# Patient Record
Sex: Female | Born: 1975 | Hispanic: Yes | Marital: Married | Smoking: Never smoker
Health system: Southern US, Community
[De-identification: ages and names within clinical notes are randomized; demographics above are authoritative.]

## PROBLEM LIST (undated history)

## (undated) DIAGNOSIS — O24419 Gestational diabetes mellitus in pregnancy, unspecified control: Secondary | ICD-10-CM

## (undated) DIAGNOSIS — R87629 Unspecified abnormal cytological findings in specimens from vagina: Secondary | ICD-10-CM

## (undated) DIAGNOSIS — I639 Cerebral infarction, unspecified: Secondary | ICD-10-CM

## (undated) DIAGNOSIS — K219 Gastro-esophageal reflux disease without esophagitis: Secondary | ICD-10-CM

## (undated) HISTORY — DX: Unspecified abnormal cytological findings in specimens from vagina: R87.629

---

## 2013-10-21 ENCOUNTER — Ambulatory Visit: Payer: Self-pay | Admitting: Advanced Practice Midwife

## 2013-11-19 ENCOUNTER — Observation Stay: Payer: Self-pay

## 2013-11-19 LAB — URINALYSIS, COMPLETE
BILIRUBIN, UR: NEGATIVE
Bacteria: NONE SEEN
Blood: NEGATIVE
Glucose,UR: NEGATIVE mg/dL (ref 0–75)
Ketone: NEGATIVE
LEUKOCYTE ESTERASE: NEGATIVE
NITRITE: NEGATIVE
PROTEIN: NEGATIVE
Ph: 7 (ref 4.5–8.0)
RBC,UR: NONE SEEN /HPF (ref 0–5)
SPECIFIC GRAVITY: 1.004 (ref 1.003–1.030)
Squamous Epithelial: 1
WBC UR: <1 /HPF (ref 0–5)

## 2013-12-01 ENCOUNTER — Encounter: Payer: Self-pay | Admitting: Maternal & Fetal Medicine

## 2014-04-13 ENCOUNTER — Inpatient Hospital Stay: Payer: Self-pay | Admitting: Obstetrics and Gynecology

## 2014-04-13 LAB — CBC WITH DIFFERENTIAL/PLATELET
Basophil #: 0 10*3/uL (ref 0.0–0.1)
Basophil %: 0.5 %
EOS PCT: 1.3 %
Eosinophil #: 0.1 10*3/uL (ref 0.0–0.7)
HCT: 35.7 % (ref 35.0–47.0)
HGB: 12 g/dL (ref 12.0–16.0)
LYMPHS ABS: 2.9 10*3/uL (ref 1.0–3.6)
Lymphocyte %: 37.6 %
MCH: 28.5 pg (ref 26.0–34.0)
MCHC: 33.4 g/dL (ref 32.0–36.0)
MCV: 85 fL (ref 80–100)
MONOS PCT: 10 %
Monocyte #: 0.8 x10 3/mm (ref 0.2–0.9)
NEUTROS PCT: 50.6 %
Neutrophil #: 3.9 10*3/uL (ref 1.4–6.5)
PLATELETS: 275 10*3/uL (ref 150–440)
RBC: 4.19 10*6/uL (ref 3.80–5.20)
RDW: 16.9 % — ABNORMAL HIGH (ref 11.5–14.5)
WBC: 7.7 10*3/uL (ref 3.6–11.0)

## 2014-04-15 LAB — HEMATOCRIT: HCT: 35.1 % (ref 35.0–47.0)

## 2014-09-05 NOTE — H&P (Signed)
L&D Evaluation:  History:  HPI 39yo [id number] at 40+6 wks presenting for eval with decreased fetal movement x24hrs. On evaluation, reactive strip with accels and improved fetal movement. However, AFI low-normal at 6cm and fetal tracing revealed 1 late deceleration. The decision was made to proceed with induction for term pregnancy with low fluid in setting of decreased fetal movement.  Pregnancy complicated by: Prior 36wk delivery for 3#4oz baby- patient declined 17-P during this pregnancy. 3 prior NSVDs. Increased glucose screening test with normal 3hr Obesity- BMI 31 AMA Food insecurity Abnormal pap smear with plans for ppd colpo  Rh pos, Rubella immune, GBS neg   Presents with decreased fetal movement   Patient's Medical History No Chronic Illness   Patient's Surgical History none   Medications Pre Natal Vitamins   Allergies NKDA   Social History food insecure   Family History Non-Contributory   ROS:  ROS All systems were reviewed.  HEENT, CNS, GI, GU, Respiratory, CV, Renal and Musculoskeletal systems were found to be normal.   Exam:  Vital Signs stable   General no apparent distress   Mental Status clear   Chest clear   Heart normal sinus rhythm   Abdomen gravid, non-tender   Estimated Fetal Weight Average for gestational age   Fetal Position cephalic   Back no CVAT   Edema 2+  trace   Mebranes Intact   FHT single late decel   Ucx irregular   Impression:  Impression decreased fetal movement   Plan:  Plan IOL   Comments Bishop score indicates cervical ripening indicated. Will cervadil overnight and pitocin in am. Pt and family members present and aware.  Interview proceeded with hospital Spanish interpreter present.   Electronic Signatures: Cline CoolsBeasley, Reatha Sur E (MD)  (Signed 18-Dec-15 08:35)  Authored: L&D Evaluation   Last Updated: 18-Dec-15 08:35 by Cline CoolsBeasley, Analena Gama E (MD)

## 2016-02-27 DIAGNOSIS — N879 Dysplasia of cervix uteri, unspecified: Secondary | ICD-10-CM | POA: Insufficient documentation

## 2016-02-27 HISTORY — PX: COLPOSCOPY: SHX161

## 2018-05-19 LAB — HM PAP SMEAR: HM Pap smear: NEGATIVE

## 2018-05-19 LAB — HM HIV SCREENING LAB: HM HIV Screening: NEGATIVE

## 2018-08-10 DIAGNOSIS — Z8742 Personal history of other diseases of the female genital tract: Secondary | ICD-10-CM

## 2019-01-24 ENCOUNTER — Encounter: Payer: Self-pay | Admitting: *Deleted

## 2019-01-24 ENCOUNTER — Inpatient Hospital Stay
Admission: EM | Admit: 2019-01-24 | Discharge: 2019-01-29 | DRG: 123 | Disposition: A | Discharge status: Home / Self Care | Payer: Medicaid Other | Attending: Internal Medicine | Admitting: Internal Medicine

## 2019-01-24 ENCOUNTER — Emergency Department: Payer: Medicaid Other

## 2019-01-24 ENCOUNTER — Other Ambulatory Visit: Payer: Self-pay

## 2019-01-24 DIAGNOSIS — R739 Hyperglycemia, unspecified: Secondary | ICD-10-CM | POA: Diagnosis not present

## 2019-01-24 DIAGNOSIS — H5462 Unqualified visual loss, left eye, normal vision right eye: Secondary | ICD-10-CM | POA: Diagnosis present

## 2019-01-24 DIAGNOSIS — Z975 Presence of (intrauterine) contraceptive device: Secondary | ICD-10-CM

## 2019-01-24 DIAGNOSIS — Z8673 Personal history of transient ischemic attack (TIA), and cerebral infarction without residual deficits: Secondary | ICD-10-CM | POA: Diagnosis not present

## 2019-01-24 DIAGNOSIS — T380X5A Adverse effect of glucocorticoids and synthetic analogues, initial encounter: Secondary | ICD-10-CM | POA: Diagnosis not present

## 2019-01-24 DIAGNOSIS — Z20828 Contact with and (suspected) exposure to other viral communicable diseases: Secondary | ICD-10-CM | POA: Diagnosis present

## 2019-01-24 DIAGNOSIS — K219 Gastro-esophageal reflux disease without esophagitis: Secondary | ICD-10-CM | POA: Diagnosis present

## 2019-01-24 DIAGNOSIS — Z23 Encounter for immunization: Secondary | ICD-10-CM | POA: Diagnosis not present

## 2019-01-24 DIAGNOSIS — Z833 Family history of diabetes mellitus: Secondary | ICD-10-CM

## 2019-01-24 DIAGNOSIS — H469 Unspecified optic neuritis: Principal | ICD-10-CM | POA: Diagnosis present

## 2019-01-24 DIAGNOSIS — Z8632 Personal history of gestational diabetes: Secondary | ICD-10-CM | POA: Diagnosis not present

## 2019-01-24 DIAGNOSIS — K5900 Constipation, unspecified: Secondary | ICD-10-CM | POA: Diagnosis not present

## 2019-01-24 HISTORY — DX: Cerebral infarction, unspecified: I63.9

## 2019-01-24 HISTORY — DX: Gastro-esophageal reflux disease without esophagitis: K21.9

## 2019-01-24 HISTORY — DX: Gestational diabetes mellitus in pregnancy, unspecified control: O24.419

## 2019-01-24 LAB — CBC WITH DIFFERENTIAL/PLATELET
Abs Immature Granulocytes: 0.01 10*3/uL (ref 0.00–0.07)
Basophils Absolute: 0.1 10*3/uL (ref 0.0–0.1)
Basophils Relative: 1 %
Eosinophils Absolute: 0.1 10*3/uL (ref 0.0–0.5)
Eosinophils Relative: 2 %
HCT: 36.7 % (ref 36.0–46.0)
Hemoglobin: 11.6 g/dL — ABNORMAL LOW (ref 12.0–15.0)
Immature Granulocytes: 0 %
Lymphocytes Relative: 36 %
Lymphs Abs: 2.6 10*3/uL (ref 0.7–4.0)
MCH: 25.3 pg — ABNORMAL LOW (ref 26.0–34.0)
MCHC: 31.6 g/dL (ref 30.0–36.0)
MCV: 80.1 fL (ref 80.0–100.0)
Monocytes Absolute: 0.8 10*3/uL (ref 0.1–1.0)
Monocytes Relative: 12 %
Neutro Abs: 3.5 10*3/uL (ref 1.7–7.7)
Neutrophils Relative %: 49 %
Platelets: 277 10*3/uL (ref 150–400)
RBC: 4.58 MIL/uL (ref 3.87–5.11)
RDW: 16.1 % — ABNORMAL HIGH (ref 11.5–15.5)
WBC: 7.1 10*3/uL (ref 4.0–10.5)
nRBC: 0 % (ref 0.0–0.2)

## 2019-01-24 LAB — SEDIMENTATION RATE: Sed Rate: 36 mm/hr — ABNORMAL HIGH (ref 0–20)

## 2019-01-24 LAB — URINALYSIS, COMPLETE (UACMP) WITH MICROSCOPIC
Bacteria, UA: NONE SEEN
Bilirubin Urine: NEGATIVE
Glucose, UA: NEGATIVE mg/dL
Hgb urine dipstick: NEGATIVE
Ketones, ur: NEGATIVE mg/dL
Nitrite: NEGATIVE
Protein, ur: NEGATIVE mg/dL
Specific Gravity, Urine: 1.01 (ref 1.005–1.030)
pH: 7 (ref 5.0–8.0)

## 2019-01-24 LAB — BASIC METABOLIC PANEL
Anion gap: 12 (ref 5–15)
BUN: 12 mg/dL (ref 6–20)
CO2: 21 mmol/L — ABNORMAL LOW (ref 22–32)
Calcium: 8.9 mg/dL (ref 8.9–10.3)
Chloride: 104 mmol/L (ref 98–111)
Creatinine, Ser: 0.64 mg/dL (ref 0.44–1.00)
GFR calc Af Amer: >60 mL/min (ref >60)
GFR calc non Af Amer: >60 mL/min (ref >60)
Glucose, Bld: 101 mg/dL — ABNORMAL HIGH (ref 70–99)
Potassium: 3.9 mmol/L (ref 3.5–5.1)
Sodium: 137 mmol/L (ref 135–145)

## 2019-01-24 LAB — POCT PREGNANCY, URINE: Preg Test, Ur: NEGATIVE

## 2019-01-24 MED ORDER — HEPARIN SODIUM (PORCINE) 5000 UNIT/ML IJ SOLN
5000.0000 [IU] | Freq: Three times a day (TID) | INTRAMUSCULAR | Status: DC
  Administered 2019-01-24 – 2019-01-27 (×8): 5000 [IU] via SUBCUTANEOUS
  Filled 2019-01-24 (×6): qty 1

## 2019-01-24 MED ORDER — PANTOPRAZOLE SODIUM 40 MG IV SOLR
40.0000 mg | Freq: Once | INTRAVENOUS | Status: AC
  Administered 2019-01-24: 40 mg via INTRAVENOUS
  Filled 2019-01-24: qty 40

## 2019-01-24 MED ORDER — SODIUM CHLORIDE 0.9 % IV SOLN
1000.0000 mg | Freq: Every day | INTRAVENOUS | Status: DC
  Administered 2019-01-24: 1000 mg via INTRAVENOUS
  Filled 2019-01-24 (×3): qty 8

## 2019-01-24 MED ORDER — DIPHENHYDRAMINE HCL 50 MG/ML IJ SOLN
25.0000 mg | Freq: Once | INTRAMUSCULAR | Status: AC
  Administered 2019-01-24: 25 mg via INTRAVENOUS
  Filled 2019-01-24: qty 1

## 2019-01-24 MED ORDER — PROCHLORPERAZINE EDISYLATE 10 MG/2ML IJ SOLN
10.0000 mg | Freq: Once | INTRAMUSCULAR | Status: AC
  Administered 2019-01-24: 10 mg via INTRAVENOUS
  Filled 2019-01-24: qty 2

## 2019-01-24 MED ORDER — KETOROLAC TROMETHAMINE 30 MG/ML IJ SOLN
15.0000 mg | Freq: Once | INTRAMUSCULAR | Status: AC
  Administered 2019-01-24: 15 mg via INTRAVENOUS
  Filled 2019-01-24: qty 1

## 2019-01-24 MED ORDER — INFLUENZA VAC SPLIT QUAD 0.5 ML IM SUSY
0.5000 mL | PREFILLED_SYRINGE | INTRAMUSCULAR | Status: AC
  Administered 2019-01-25: 0.5 mL via INTRAMUSCULAR
  Filled 2019-01-24: qty 0.5

## 2019-01-24 MED ORDER — PANTOPRAZOLE SODIUM 40 MG PO TBEC
40.0000 mg | DELAYED_RELEASE_TABLET | Freq: Every day | ORAL | Status: DC
  Administered 2019-01-25 – 2019-01-29 (×5): 40 mg via ORAL
  Filled 2019-01-24 (×5): qty 1

## 2019-01-24 MED ORDER — GADOBUTROL 1 MMOL/ML IV SOLN
7.0000 mL | Freq: Once | INTRAVENOUS | Status: AC | PRN
  Administered 2019-01-24: 18:00:00 7 mL via INTRAVENOUS

## 2019-01-24 MED ORDER — TETRACAINE HCL 0.5 % OP SOLN
1.0000 [drp] | Freq: Once | OPHTHALMIC | Status: AC
  Administered 2019-01-24: 1 [drp] via OPHTHALMIC

## 2019-01-24 MED ORDER — DOCUSATE SODIUM 100 MG PO CAPS
100.0000 mg | ORAL_CAPSULE | Freq: Two times a day (BID) | ORAL | Status: DC | PRN
  Administered 2019-01-28: 100 mg via ORAL
  Filled 2019-01-24: qty 1

## 2019-01-24 MED ORDER — SODIUM CHLORIDE 0.9 % IV BOLUS
1000.0000 mL | Freq: Once | INTRAVENOUS | Status: AC
  Administered 2019-01-24: 1000 mL via INTRAVENOUS

## 2019-01-24 NOTE — ED Notes (Signed)
Charge RN Nira Conn called for a bed.

## 2019-01-24 NOTE — ED Notes (Addendum)
ED Provider at bedside with interpreter at bedside

## 2019-01-24 NOTE — ED Notes (Signed)
Report given to Silvia, RN

## 2019-01-24 NOTE — ED Notes (Signed)
Patient transported to MRI 

## 2019-01-24 NOTE — ED Triage Notes (Addendum)
Through [place] interpreter, patient states she has had a headache on left side of head 6 days and has gradually loss vision in left eye. Patient was sent here from [facility]. Patient has a history of CVA in 2001 and was positive for Covid 3 months ago. Patient also c/o sore throat and it hurts to swallow.

## 2019-01-24 NOTE — ED Provider Notes (Signed)
[facility] Emergency Department Provider Note  ____________________________________________   First MD Initiated Contact with Patient 01/24/19 1542     (approximate)  I have reviewed the triage vital signs and the nursing notes.   HISTORY  Chief Complaint Headache    HPI [facility] Higgins is a 43 y.o. female with reported history of stroke in the past here with eye pain.  History provided with Spanish interpreter.   Patient states her symptoms started approximately 6 to 7 days ago.  She initially noticed clouds in her left eye, with diminished vision.  She had a dull, aching, retrobulbar pain at that time.  Since then, she is developed progressively worsening and now essentially complete loss of vision of the left eye.  She says she can occasionally see shapes, but that is about it.  This is new.  She does report that she was told she had a stroke in [place] years ago, but says that it was stress related.  She denies any history of complex migraines.  Denies any facial numbness or weakness.  Denies any arm leg or numbness.  No other complaints.  No recent trauma.  No recent fevers or chills.  Of note, she did have COVID 3 months ago, but has gotten over this completely according to her report.       Past Medical History:  Diagnosis Date   GERD (gastroesophageal reflux disease)    Stroke [facility])     Patient Active Problem List   Diagnosis Date Noted   History of abnormal cervical Papanicolaou smear 08/10/2018    Past Surgical History:  Procedure Laterality Date   COLPOSCOPY  02/27/2016   Colpo bx results: Transformation zone tissue with no dysplasia or carcinoma identified    Prior to Admission medications   Medication Sig Start Date End Date Taking? Authorizing Provider  PARAGARD INTRAUTERINE COPPER IU by Intrauterine route. 05/19/18 05/19/28  Jerene Dilling, PA    Allergies Patient has no known allergies.  No family history on  file.  Social History Social History   Tobacco Use   Smoking status: Never Smoker   Smokeless tobacco: Never Used  Substance Use Topics   Alcohol use: Never    Frequency: Never   Drug use: Never    Review of Systems  Review of Systems  Constitutional: Positive for fatigue. Negative for fever.  HENT: Negative for congestion and sore throat.   Eyes: Positive for pain and visual disturbance.  Respiratory: Negative for cough and shortness of breath.   Cardiovascular: Negative for chest pain.  Gastrointestinal: Negative for abdominal pain, diarrhea, nausea and vomiting.  Genitourinary: Negative for flank pain.  Musculoskeletal: Negative for back pain and neck pain.  Skin: Negative for rash and wound.  Neurological: Negative for weakness.  All other systems reviewed and are negative.    ____________________________________________  PHYSICAL EXAM:      VITAL SIGNS: [facility] Triage Vitals  Enc Vitals Group     BP 01/24/19 1421 127/73     Pulse Rate 01/24/19 1421 73     Resp 01/24/19 1421 18     Temp 01/24/19 1421 98.5 F (36.9 C)     Temp Source 01/24/19 1421 Oral     SpO2 01/24/19 1421 99 %     Weight 01/24/19 1429 170 lb (77.1 kg)     Height 01/24/19 1429 5\' 4"  (1.626 m)     Head Circumference --      Peak Flow --  Pain Score 01/24/19 1427 10     Pain Loc --      Pain Edu? --      Excl. in GC? --      Physical Exam Vitals signs and nursing note reviewed.  Constitutional:      General: She is not in acute distress.    Appearance: She is well-developed.  HENT:     Head: Normocephalic and atraumatic.     Comments: Oropharynx clear.  No facial lesions.  TMs normal bilaterally. Eyes:     Conjunctiva/sclera: Conjunctivae normal.     Comments: Vision intact on right, essentially light/dark on left.  Relative afferent pupillary defect on the left.  IOP 12 bilaterally.  Neck:     Musculoskeletal: Neck supple.  Cardiovascular:     Rate and Rhythm: Normal rate and  regular rhythm.     Heart sounds: Normal heart sounds. No murmur. No friction rub.  Pulmonary:     Effort: Pulmonary effort is normal. No respiratory distress.     Breath sounds: Normal breath sounds. No wheezing or rales.  Abdominal:     General: There is no distension.     Palpations: Abdomen is soft.     Tenderness: There is no abdominal tenderness.  Skin:    General: Skin is warm.     Capillary Refill: Capillary refill takes less than 2 seconds.  Neurological:     Mental Status: She is alert and oriented to person, place, and time.     Motor: No abnormal muscle tone.       ____________________________________________   LABS (all labs ordered are listed, but only abnormal results are displayed)  Labs Reviewed  CBC WITH DIFFERENTIAL/PLATELET - Abnormal; Notable for the following components:      Result Value   Hemoglobin 11.6 (*)    MCH 25.3 (*)    RDW 16.1 (*)    All other components within normal limits  BASIC METABOLIC PANEL - Abnormal; Notable for the following components:   CO2 21 (*)    Glucose, Bld 101 (*)    All other components within normal limits  URINALYSIS, COMPLETE (UACMP) WITH MICROSCOPIC - Abnormal; Notable for the following components:   Color, Urine YELLOW (*)    APPearance HAZY (*)    Leukocytes,Ua LARGE (*)    All other components within normal limits  SARS CORONAVIRUS 2 (TAT 6-24 HRS)  RPR  HIV ANTIBODY (ROUTINE TESTING W REFLEX)  SEDIMENTATION RATE  C-REACTIVE PROTEIN  POC URINE PREG, [facility]  POCT PREGNANCY, URINE    ____________________________________________  EKG: As below. ________________________________________  RADIOLOGY All imaging, including plain films, CT scans, and ultrasounds, independently reviewed by me, and interpretations confirmed via formal radiology reads.  ED MD interpretation:   See [facility] course for personal interpretations.  Official radiology report(s): Ct Head Wo Contrast  Result Date: 01/24/2019 CLINICAL DATA:   Left side headache for 6 days with gradual onset of vision loss in the left eye. EXAM: CT HEAD WITHOUT CONTRAST TECHNIQUE: Contiguous axial images were obtained from the base of the skull through the vertex without intravenous contrast. COMPARISON:  None. FINDINGS: Brain: No evidence of acute infarction, hemorrhage, hydrocephalus, extra-axial collection or mass lesion/mass effect. Vascular: No hyperdense vessel or unexpected calcification. Skull: Normal. Negative for fracture or focal lesion. Sinuses/Orbits: Negative. Other: None. IMPRESSION: Normal head CT. Electronically Signed   By: Drusilla Kanner M.D.   On: 01/24/2019 15:48   Mr Laqueta Jean And Wo Contrast  Result Date: 01/24/2019  CLINICAL DATA:  Neuro deficit(s), headache, basilar or orbital headache, visual loss, left afferent pupillary defect. Additional history provided: Patient reports headache on left side of head for 6 days and gradual loss of vision in left eye. History of CVA in 2001. EXAM: MRI HEAD AND ORBITS WITHOUT AND WITH CONTRAST TECHNIQUE: Multiplanar, multiecho pulse sequences of the brain and surrounding structures were obtained without and with intravenous contrast. Multiplanar, multiecho pulse sequences of the orbits and surrounding structures were obtained including fat saturation techniques, before and after intravenous contrast administration. CONTRAST:  7mL GADAVIST GADOBUTROL 1 MMOL/ML IV SOLN COMPARISON:  Head CT 01/24/2019 FINDINGS: MRI HEAD FINDINGS Brain: There is no convincing evidence of acute infarct. No evidence of intracranial mass. No midline shift or extra-axial fluid collection. No chronic intracranial blood products. No focal parenchymal signal abnormality. Cerebral volume is normal for age. No abnormal intracranial enhancement is identified. Vascular: Flow voids maintained within the proximal large arterial vessels. Skull and upper cervical spine: No focal marrow lesion MRI ORBITS FINDINGS Orbits: There is abnormal T2  hyperintensity and swelling of the intracanalicular and intraorbital left optic nerve. Associated abnormal enhancement of the intracanalicular and intraorbital left optic nerve sheath. Additionally, there is abnormal enhancement of the intraorbital left optic nerve distally. There is apparent increased DWI signal within the intraorbital left optic nerve on concurrent brain MRI, although no definite corresponding ADC hypointensity is identified. The globes are normal in size and contour. The extraocular muscles are grossly symmetric. The suprasellar cistern is patent. No mass effect upon the optic chiasm. Visualized sinuses: Mild scattered paranasal sinus mucosal thickening. Frothy secretions within right-sided ethmoid air cells. Small right sphenoid sinus air-fluid level. Soft tissues: Negative Limited intracranial: Reported above under the MRI brain findings section. These results were called by telephone at the time of interpretation on 01/24/2019 at 6:55 pm to provider Shaune Pollack , who verbally acknowledged these results. IMPRESSION: MRI orbits: 1. T2 hyperintense signal abnormality and swelling of the intracanalicular and intraorbital left optic nerve. Associated abnormal enhancement of the intracanalicular and intraorbital left optic nerve sheath, as well as abnormal enhancement of the intraorbital left optic nerve distally. Findings are consistent with optic neuritis. 2. Paranasal sinus disease as described. This includes frothy secretions within right ethmoid air cells and a small right sphenoid sinus air-fluid level. Correlate for acute sinusitis. MRI head: Unremarkable MRI appearance of the brain for age. Electronically Signed   By: Jackey Loge   On: 01/24/2019 18:56   Mr Rockwell [place] Wo Contrast  Result Date: 01/24/2019 CLINICAL DATA:  Neuro deficit(s), headache, basilar or orbital headache, visual loss, left afferent pupillary defect. Additional history provided: Patient reports headache on left side  of head for 6 days and gradual loss of vision in left eye. History of CVA in 2001. EXAM: MRI HEAD AND ORBITS WITHOUT AND WITH CONTRAST TECHNIQUE: Multiplanar, multiecho pulse sequences of the brain and surrounding structures were obtained without and with intravenous contrast. Multiplanar, multiecho pulse sequences of the orbits and surrounding structures were obtained including fat saturation techniques, before and after intravenous contrast administration. CONTRAST:  74mL GADAVIST GADOBUTROL 1 MMOL/ML IV SOLN COMPARISON:  Head CT 01/24/2019 FINDINGS: MRI HEAD FINDINGS Brain: There is no convincing evidence of acute infarct. No evidence of intracranial mass. No midline shift or extra-axial fluid collection. No chronic intracranial blood products. No focal parenchymal signal abnormality. Cerebral volume is normal for age. No abnormal intracranial enhancement is identified. Vascular: Flow voids maintained within the proximal large arterial vessels. Skull  and upper cervical spine: No focal marrow lesion MRI ORBITS FINDINGS Orbits: There is abnormal T2 hyperintensity and swelling of the intracanalicular and intraorbital left optic nerve. Associated abnormal enhancement of the intracanalicular and intraorbital left optic nerve sheath. Additionally, there is abnormal enhancement of the intraorbital left optic nerve distally. There is apparent increased DWI signal within the intraorbital left optic nerve on concurrent brain MRI, although no definite corresponding ADC hypointensity is identified. The globes are normal in size and contour. The extraocular muscles are grossly symmetric. The suprasellar cistern is patent. No mass effect upon the optic chiasm. Visualized sinuses: Mild scattered paranasal sinus mucosal thickening. Frothy secretions within right-sided ethmoid air cells. Small right sphenoid sinus air-fluid level. Soft tissues: Negative Limited intracranial: Reported above under the MRI brain findings section.  These results were called by telephone at the time of interpretation on 01/24/2019 at 6:55 pm to provider Shaune PollackAMERON Eleftherios Dudenhoeffer , who verbally acknowledged these results. IMPRESSION: MRI orbits: 1. T2 hyperintense signal abnormality and swelling of the intracanalicular and intraorbital left optic nerve. Associated abnormal enhancement of the intracanalicular and intraorbital left optic nerve sheath, as well as abnormal enhancement of the intraorbital left optic nerve distally. Findings are consistent with optic neuritis. 2. Paranasal sinus disease as described. This includes frothy secretions within right ethmoid air cells and a small right sphenoid sinus air-fluid level. Correlate for acute sinusitis. MRI head: Unremarkable MRI appearance of the brain for age. Electronically Signed   By: Jackey LogeKyle  Golden   On: 01/24/2019 18:56    ____________________________________________  PROCEDURES   Procedure(s) performed (including Critical Care):  Procedures  ____________________________________________  INITIAL IMPRESSION / MDM / ASSESSMENT AND PLAN / [facility] COURSE  As part of my medical decision making, I reviewed the following data within the electronic MEDICAL RECORD NUMBER Notes from prior [facility] visits and [place] Controlled Substance Database      *Bonnita HollowGeraldina Villalobos Flores was evaluated in Emergency Department on 01/24/2019 for the symptoms described in the history of present illness. She was evaluated in the context of the global COVID-19 pandemic, which necessitated consideration that the patient might be at risk for infection with the SARS-CoV-2 virus that causes COVID-19. Institutional protocols and algorithms that pertain to the evaluation of patients at risk for COVID-19 are in a state of rapid change based on information released by regulatory bodies including the CDC and federal and state organizations. These policies and algorithms were followed during the patient's care in the [facility].  Some [facility] evaluations and  interventions may be delayed as a result of limited staffing during the pandemic.*   Clinical Course as of Jan 23 1934  Mon Jan 24, 2019  1550 Normal sinus rhythm, ventricular rate 68.  PR 172, QRS 85, QTc 400.  No acute ST or T-segment elevation or depression.  [facility] EKG [CI]  1629 No acute abnormality.  CT Head Wo Contrast [CI]  [id number] year old female here with left eye pain and loss of vision.  Primary concern is optic neuritis, optic ischemia, or other post chiasmal abnormality such as mass or lesion.  Intraocular pressures been without signs of acute glaucoma.  There is no eye redness or symptoms to suggest iritis or uveitis.  Vision loss is total, without apparent retinal detachment on funduscopic exam or clinically.  Will follow-up MRIs.  CT scan negative.  Labs reassuring.   [CI]  1934 MRI shows optic neuritis.  [facility] LIkely needs 5d of IV Steroids. Will admit here for  neuro c/s in AM. Solumedrol 1 g IV ordered.   [CI]    Clinical Course User Index [CI] Shaune Pollack, MD    Medical Decision Making:  As above.   ____________________________________________  FINAL CLINICAL IMPRESSION(S) / [facility] DIAGNOSES  Final diagnoses:  Optic neuritis     MEDICATIONS GIVEN DURING THIS VISIT:  Medications  methylPREDNISolone sodium succinate (SOLU-MEDROL) 1,000 mg in sodium chloride 0.9 % 50 mL IVPB (has no administration in time range)  pantoprazole (PROTONIX) injection 40 mg (has no administration in time range)  prochlorperazine (COMPAZINE) injection 10 mg (10 mg Intravenous Given 01/24/19 1637)  diphenhydrAMINE (BENADRYL) injection 25 mg (25 mg Intravenous Given 01/24/19 1642)  ketorolac (TORADOL) 30 MG/ML injection 15 mg (15 mg Intravenous Given 01/24/19 1636)  sodium chloride 0.9 % bolus 1,000 mL (1,000 mLs Intravenous New Bag/Given 01/24/19 1634)  tetracaine (PONTOCAINE) 0.5 % ophthalmic solution 1 drop (1 drop Left Eye Given 01/24/19 1634)  gadobutrol  (GADAVIST) 1 MMOL/ML injection 7 mL (7 mLs Intravenous Contrast Given 01/24/19 1753)     [facility] Discharge Orders    None       Note:  This document was prepared using Dragon voice recognition software and may include unintentional dictation errors.   Shaune Pollack, MD 01/24/19 Barry Brunner

## 2019-01-24 NOTE — ED Notes (Signed)
Report given to Stephen, RN 

## 2019-01-24 NOTE — H&P (Signed)
[employer] - [place] [facility]   PATIENT NAME: Chelsea Higgins    MR#:  [id number]  DATE OF BIRTH:  [birth year]  DATE OF ADMISSION:  01/24/2019  PRIMARY CARE PHYSICIAN: Patient, No Pcp Per   REQUESTING/REFERRING PHYSICIAN: isaacs  CHIEF COMPLAINT:   Chief Complaint  Patient presents with  . Headache    HISTORY OF PRESENT ILLNESS: Chelsea Higgins  is a 43 y.o. female with a known history of gastroesophageal reflux disease, gestational diabetes, stroke-started having headache and loss of vision from left eye for last 6 days.  This is persistence and not going away so decided to come to emergency room.  On further questioning she says that she sees some bright lights and some grayish color to her left eye but cannot see clear. She denies any associated focal neurological symptoms like weakness, tingling, numbness.  She denies any trauma.  She denies any similar episodes in the past. In ER MRI of the brain reported optic neuritis.  ER physician spoke to tele-neurologist who suggested to give 1 g of Solu-Medrol IV for 5 days and admit for further management.  PAST MEDICAL HISTORY:   Past Medical History:  Diagnosis Date  . GERD (gastroesophageal reflux disease)   . Gestational diabetes   . Stroke [facility])     PAST SURGICAL HISTORY:  Past Surgical History:  Procedure Laterality Date  . COLPOSCOPY  02/27/2016   Colpo bx results: Transformation zone tissue with no dysplasia or carcinoma identified    SOCIAL HISTORY:  Social History   Tobacco Use  . Smoking status: Never Smoker  . Smokeless tobacco: Never Used  Substance Use Topics  . Alcohol use: Never    Frequency: Never    FAMILY HISTORY:  Family History  Problem Relation Age of Onset  . Diabetes Mother     DRUG ALLERGIES: No Known Allergies  REVIEW OF SYSTEMS:   CONSTITUTIONAL: No fever, fatigue or weakness.  EYES: She have blurred vision from left eye.  EARS, NOSE, AND  THROAT: No tinnitus or ear pain.  RESPIRATORY: No cough, shortness of breath, wheezing or hemoptysis.  CARDIOVASCULAR: No chest pain, orthopnea, edema.  GASTROINTESTINAL: No nausea, vomiting, diarrhea or abdominal pain.  GENITOURINARY: No dysuria, hematuria.  ENDOCRINE: No polyuria, nocturia,  HEMATOLOGY: No anemia, easy bruising or bleeding SKIN: No rash or lesion. MUSCULOSKELETAL: No joint pain or arthritis.   NEUROLOGIC: No tingling, numbness, weakness.  PSYCHIATRY: No anxiety or depression.   MEDICATIONS AT HOME:  Prior to Admission medications   Medication Sig Start Date End Date Taking? Authorizing Provider  PARAGARD INTRAUTERINE COPPER IU by Intrauterine route. 05/19/18 05/19/28  Matt Holmes, PA      PHYSICAL EXAMINATION:   VITAL SIGNS: Blood pressure 137/81, pulse 72, temperature 98.5 F (36.9 C), temperature source Oral, resp. rate 14, height  (1.626 m), weight 77.1 kg, last menstrual period 01/04/2019, SpO2 100 %.  GENERAL:  43 y.o.-year-old patient lying in the bed with no acute distress.  EYES: Pupils equal, round, reactive to light and accommodation. No scleral icterus. Extraocular muscles intact.  HEENT: Head atraumatic, normocephalic. Oropharynx and nasopharynx clear.  NECK:  Supple, no jugular venous distention. No thyroid enlargement, no tenderness.  LUNGS: Normal breath sounds bilaterally, no wheezing, rales,rhonchi or crepitation. No use of accessory muscles of respiration.  CARDIOVASCULAR: S1, S2 normal. No murmurs, rubs, or gallops.  ABDOMEN: Soft, nontender, nondistended. Bowel sounds present. No organomegaly or mass.  EXTREMITIES: No pedal edema, cyanosis, or  clubbing.  NEUROLOGIC: Cranial nerves II through XII are intact. Muscle strength 5/5 in all extremities. Sensation intact. Gait not checked.  PSYCHIATRIC: The patient is alert and oriented x 3.  SKIN: No obvious rash, lesion, or ulcer.   LABORATORY PANEL:   CBC Recent Labs  Lab  01/24/19 1513  WBC 7.1  HGB 11.6*  HCT 36.7  PLT 277  MCV 80.1  MCH 25.3*  MCHC 31.6  RDW 16.1*  LYMPHSABS 2.6  MONOABS 0.8  EOSABS 0.1  BASOSABS 0.1   ------------------------------------------------------------------------------------------------------------------  Chemistries  Recent Labs  Lab 01/24/19 1513  NA 137  K 3.9  CL 104  CO2 21*  GLUCOSE 101*  BUN 12  CREATININE 0.64  CALCIUM 8.9   ------------------------------------------------------------------------------------------------------------------ estimated creatinine clearance is 93.1 mL/min (by C-G formula based on SCr of 0.64 mg/dL). ------------------------------------------------------------------------------------------------------------------ No results for input(s): TSH, T4TOTAL, T3FREE, THYROIDAB in the last 72 hours.  Invalid input(s): FREET3   Coagulation profile No results for input(s): INR, PROTIME in the last 168 hours. ------------------------------------------------------------------------------------------------------------------- No results for input(s): DDIMER in the last 72 hours. -------------------------------------------------------------------------------------------------------------------  Cardiac Enzymes No results for input(s): CKMB, TROPONINI, MYOGLOBIN in the last 168 hours.  Invalid input(s): CK ------------------------------------------------------------------------------------------------------------------ Invalid input(s): POCBNP  ---------------------------------------------------------------------------------------------------------------  Urinalysis    Component Value Date/Time   COLORURINE YELLOW (A) 01/24/2019 1513   APPEARANCEUR HAZY (A) 01/24/2019 1513   APPEARANCEUR Clear 11/19/2013 1932   LABSPEC 1.010 01/24/2019 1513   LABSPEC 1.004 11/19/2013 1932   PHURINE 7.0 01/24/2019 1513   GLUCOSEU NEGATIVE 01/24/2019 1513   GLUCOSEU Negative 11/19/2013 1932    HGBUR NEGATIVE 01/24/2019 1513   BILIRUBINUR NEGATIVE 01/24/2019 1513   BILIRUBINUR Negative 11/19/2013 1932   KETONESUR NEGATIVE 01/24/2019 1513   PROTEINUR NEGATIVE 01/24/2019 1513   NITRITE NEGATIVE 01/24/2019 1513   LEUKOCYTESUR LARGE (A) 01/24/2019 1513   LEUKOCYTESUR Negative 11/19/2013 1932     RADIOLOGY: Ct Head Wo Contrast  Result Date: 01/24/2019 CLINICAL DATA:  Left side headache for 6 days with gradual onset of vision loss in the left eye. EXAM: CT HEAD WITHOUT CONTRAST TECHNIQUE: Contiguous axial images were obtained from the base of the skull through the vertex without intravenous contrast. COMPARISON:  None. FINDINGS: Brain: No evidence of acute infarction, hemorrhage, hydrocephalus, extra-axial collection or mass lesion/mass effect. Vascular: No hyperdense vessel or unexpected calcification. Skull: Normal. Negative for fracture or focal lesion. Sinuses/Orbits: Negative. Other: None. IMPRESSION: Normal head CT. Electronically Signed   By: Inge Rise M.D.   On: 01/24/2019 15:48   Mr Brain W And Wo Contrast  Result Date: 01/24/2019 CLINICAL DATA:  Neuro deficit(s), headache, basilar or orbital headache, visual loss, left afferent pupillary defect. Additional history provided: Patient reports headache on left side of head for 6 days and gradual loss of vision in left eye. History of CVA in 2001. EXAM: MRI HEAD AND ORBITS WITHOUT AND WITH CONTRAST TECHNIQUE: Multiplanar, multiecho pulse sequences of the brain and surrounding structures were obtained without and with intravenous contrast. Multiplanar, multiecho pulse sequences of the orbits and surrounding structures were obtained including fat saturation techniques, before and after intravenous contrast administration. CONTRAST:  59mL GADAVIST GADOBUTROL 1 MMOL/ML IV SOLN COMPARISON:  Head CT 01/24/2019 FINDINGS: MRI HEAD FINDINGS Brain: There is no convincing evidence of acute infarct. No evidence of intracranial mass. No midline  shift or extra-axial fluid collection. No chronic intracranial blood products. No focal parenchymal signal abnormality. Cerebral volume is normal for age. No abnormal intracranial enhancement is identified. Vascular: Flow voids  maintained within the proximal large arterial vessels. Skull and upper cervical spine: No focal marrow lesion MRI ORBITS FINDINGS Orbits: There is abnormal T2 hyperintensity and swelling of the intracanalicular and intraorbital left optic nerve. Associated abnormal enhancement of the intracanalicular and intraorbital left optic nerve sheath. Additionally, there is abnormal enhancement of the intraorbital left optic nerve distally. There is apparent increased DWI signal within the intraorbital left optic nerve on concurrent brain MRI, although no definite corresponding ADC hypointensity is identified. The globes are normal in size and contour. The extraocular muscles are grossly symmetric. The suprasellar cistern is patent. No mass effect upon the optic chiasm. Visualized sinuses: Mild scattered paranasal sinus mucosal thickening. Frothy secretions within right-sided ethmoid air cells. Small right sphenoid sinus air-fluid level. Soft tissues: Negative Limited intracranial: Reported above under the MRI brain findings section. These results were called by telephone at the time of interpretation on 01/24/2019 at 6:55 pm to provider Shaune Pollack , who verbally acknowledged these results. IMPRESSION: MRI orbits: 1. T2 hyperintense signal abnormality and swelling of the intracanalicular and intraorbital left optic nerve. Associated abnormal enhancement of the intracanalicular and intraorbital left optic nerve sheath, as well as abnormal enhancement of the intraorbital left optic nerve distally. Findings are consistent with optic neuritis. 2. Paranasal sinus disease as described. This includes frothy secretions within right ethmoid air cells and a small right sphenoid sinus air-fluid level.  Correlate for acute sinusitis. MRI head: Unremarkable MRI appearance of the brain for age. Electronically Signed   By: Jackey Loge   On: 01/24/2019 18:56   Mr [place] Wo Contrast  Result Date: 01/24/2019 CLINICAL DATA:  Neuro deficit(s), headache, basilar or orbital headache, visual loss, left afferent pupillary defect. Additional history provided: Patient reports headache on left side of head for 6 days and gradual loss of vision in left eye. History of CVA in 2001. EXAM: MRI HEAD AND ORBITS WITHOUT AND WITH CONTRAST TECHNIQUE: Multiplanar, multiecho pulse sequences of the brain and surrounding structures were obtained without and with intravenous contrast. Multiplanar, multiecho pulse sequences of the orbits and surrounding structures were obtained including fat saturation techniques, before and after intravenous contrast administration. CONTRAST:  52mL GADAVIST GADOBUTROL 1 MMOL/ML IV SOLN COMPARISON:  Head CT 01/24/2019 FINDINGS: MRI HEAD FINDINGS Brain: There is no convincing evidence of acute infarct. No evidence of intracranial mass. No midline shift or extra-axial fluid collection. No chronic intracranial blood products. No focal parenchymal signal abnormality. Cerebral volume is normal for age. No abnormal intracranial enhancement is identified. Vascular: Flow voids maintained within the proximal large arterial vessels. Skull and upper cervical spine: No focal marrow lesion MRI ORBITS FINDINGS Orbits: There is abnormal T2 hyperintensity and swelling of the intracanalicular and intraorbital left optic nerve. Associated abnormal enhancement of the intracanalicular and intraorbital left optic nerve sheath. Additionally, there is abnormal enhancement of the intraorbital left optic nerve distally. There is apparent increased DWI signal within the intraorbital left optic nerve on concurrent brain MRI, although no definite corresponding ADC hypointensity is identified. The globes are normal in size and  contour. The extraocular muscles are grossly symmetric. The suprasellar cistern is patent. No mass effect upon the optic chiasm. Visualized sinuses: Mild scattered paranasal sinus mucosal thickening. Frothy secretions within right-sided ethmoid air cells. Small right sphenoid sinus air-fluid level. Soft tissues: Negative Limited intracranial: Reported above under the MRI brain findings section. These results were called by telephone at the time of interpretation on 01/24/2019 at 6:55 pm to provider [facility] ,  who verbally acknowledged these results. IMPRESSION: MRI orbits: 1. T2 hyperintense signal abnormality and swelling of the intracanalicular and intraorbital left optic nerve. Associated abnormal enhancement of the intracanalicular and intraorbital left optic nerve sheath, as well as abnormal enhancement of the intraorbital left optic nerve distally. Findings are consistent with optic neuritis. 2. Paranasal sinus disease as described. This includes frothy secretions within right ethmoid air cells and a small right sphenoid sinus air-fluid level. Correlate for acute sinusitis. MRI head: Unremarkable MRI appearance of the brain for age. Electronically Signed   By: Kyle  Golden   On: 01/24/2019 18:56    EJackey LogeKG: Orders placed or performed during the hospital encounter of 01/24/19  . ED EKG  . ED EKG  . EKG 12-Lead  . EKG 12-Lead    IMPRESSION AND PLAN:  *Optic neuritis Confirmed by MRI. ER physician spoke to tele-neurologist who suggested to give 1 g IV Solu-Medrol daily for 5 days. Started by ER.  I will call neurology consult for in-house help.  *Gastroesophageal reflux disease I will give oral pantoprazole while in hospital.  All the records are reviewed and case discussed with ED provider. Management plans discussed with the patient, family and they are in agreement.  CODE STATUS: Full code    TOTAL TIME TAKING CARE OF THIS PATIENT: 45 minutes.    Altamese DillingVaibhavkumar Ceili Boshers M.D on  01/24/2019   Between 7am to 6pm - Pager - [phone]  After 6pm go to [email or web address] - [email or web address]  [facility]  Office  [phone]  CC: Primary care physician; Patient, No Pcp Per   Note: This dictation was prepared with Dragon dictation along with smaller phrase technology. Any transcriptional errors that result from this process are unintentional.

## 2019-01-24 NOTE — ED Notes (Signed)
Patient transported to CT 

## 2019-01-24 NOTE — ED Notes (Signed)
Worsening loss of L eye vision over 6days ago with L sided headache as well. Hx of CVA

## 2019-01-25 ENCOUNTER — Inpatient Hospital Stay: Payer: Medicaid Other

## 2019-01-25 DIAGNOSIS — H469 Unspecified optic neuritis: Principal | ICD-10-CM

## 2019-01-25 LAB — CBC
HCT: 37.4 % (ref 36.0–46.0)
Hemoglobin: 12 g/dL (ref 12.0–15.0)
MCH: 25.5 pg — ABNORMAL LOW (ref 26.0–34.0)
MCHC: 32.1 g/dL (ref 30.0–36.0)
MCV: 79.4 fL — ABNORMAL LOW (ref 80.0–100.0)
Platelets: 306 10*3/uL (ref 150–400)
RBC: 4.71 MIL/uL (ref 3.87–5.11)
RDW: 16.1 % — ABNORMAL HIGH (ref 11.5–15.5)
WBC: 5.9 10*3/uL (ref 4.0–10.5)
nRBC: 0 % (ref 0.0–0.2)

## 2019-01-25 LAB — BASIC METABOLIC PANEL
Anion gap: 12 (ref 5–15)
BUN: 13 mg/dL (ref 6–20)
CO2: 19 mmol/L — ABNORMAL LOW (ref 22–32)
Calcium: 8.9 mg/dL (ref 8.9–10.3)
Chloride: 104 mmol/L (ref 98–111)
Creatinine, Ser: 0.81 mg/dL (ref 0.44–1.00)
GFR calc Af Amer: >60 mL/min (ref >60)
GFR calc non Af Amer: >60 mL/min (ref >60)
Glucose, Bld: 163 mg/dL — ABNORMAL HIGH (ref 70–99)
Potassium: 4 mmol/L (ref 3.5–5.1)
Sodium: 135 mmol/L (ref 135–145)

## 2019-01-25 LAB — C-REACTIVE PROTEIN: CRP: 1.6 mg/dL — ABNORMAL HIGH (ref <1.0)

## 2019-01-25 LAB — HIV ANTIBODY (ROUTINE TESTING W REFLEX): HIV Screen 4th Generation wRfx: NONREACTIVE

## 2019-01-25 LAB — SARS CORONAVIRUS 2 (TAT 6-24 HRS): SARS Coronavirus 2: NEGATIVE

## 2019-01-25 MED ORDER — GUAIFENESIN 100 MG/5ML PO SOLN
5.0000 mL | ORAL | Status: DC | PRN
  Administered 2019-01-25: 10:00:00 100 mg via ORAL
  Filled 2019-01-25: qty 10

## 2019-01-25 MED ORDER — ACETAMINOPHEN 325 MG PO TABS
650.0000 mg | ORAL_TABLET | Freq: Four times a day (QID) | ORAL | Status: DC | PRN
  Administered 2019-01-25: 19:00:00 650 mg via ORAL
  Filled 2019-01-25: qty 2

## 2019-01-25 MED ORDER — SODIUM CHLORIDE 0.9 % IV SOLN
1000.0000 mg | Freq: Every day | INTRAVENOUS | Status: AC
  Administered 2019-01-25 – 2019-01-28 (×4): 1000 mg via INTRAVENOUS
  Filled 2019-01-25 (×4): qty 8

## 2019-01-25 MED ORDER — GADOBUTROL 1 MMOL/ML IV SOLN
8.0000 mL | Freq: Once | INTRAVENOUS | Status: AC | PRN
  Administered 2019-01-25: 8 mL via INTRAVENOUS

## 2019-01-25 MED ORDER — TRAMADOL HCL 50 MG PO TABS
50.0000 mg | ORAL_TABLET | Freq: Four times a day (QID) | ORAL | Status: DC | PRN
  Administered 2019-01-25: 21:00:00 50 mg via ORAL
  Filled 2019-01-25: qty 1

## 2019-01-25 NOTE — Progress Notes (Signed)
Patient ID: Chelsea Higgins, female   DOB: 1975/07/31, 43 y.o.   MRN: [id number]  [facility] PROGRESS NOTE  Lacara Dunsworth ZHG:[id number] DOB: 03/18/1976 DOA: 01/24/2019 PCP: Patient, No Pcp Per  HPI/Subjective: Patient states that she has been having a left eye headache and difficulty with vision going on now for about 7 days.  She can only see flashes of light out of her left eye at this point.  Objective: Vitals:   01/24/19 2218 01/25/19 0428  BP: 122/64 102/70  Pulse: 63 80  Resp: 18 18  Temp: 99.1 F (37.3 C) 98.1 F (36.7 C)  SpO2: 100% 99%    Intake/Output Summary (Last 24 hours) at 01/25/2019 1342 Last data filed at 01/24/2019 2118 Gross per 24 hour  Intake 1000 ml  Output -  Net 1000 ml   Filed Weights   01/24/19 1429 01/24/19 2218  Weight: 77.1 kg 80 kg    ROS: Review of Systems  Constitutional: Negative for chills and fever.  Eyes: Positive for pain.  Respiratory: Negative for cough and shortness of breath.   Cardiovascular: Negative for chest pain.  Gastrointestinal: Negative for abdominal pain, constipation, diarrhea, nausea and vomiting.  Genitourinary: Negative for dysuria.  Musculoskeletal: Negative for joint pain.  Neurological: Negative for dizziness and headaches.   Exam: Physical Exam  Constitutional: She is oriented to person, place, and time.  HENT:  Nose: No mucosal edema.  Mouth/Throat: No oropharyngeal exudate or posterior oropharyngeal edema.  Eyes: Pupils are equal, round, and reactive to light. Conjunctivae, EOM and lids are normal.  Neck: No JVD present. Carotid bruit is not present. No edema present. No thyroid mass and no thyromegaly present.  Cardiovascular: S1 normal and S2 normal. Exam reveals no gallop.  No murmur heard. Pulses:      Dorsalis pedis pulses are 2+ on the right side and 2+ on the left side.  Respiratory: No respiratory distress. She has no wheezes. She has no rhonchi. She has no rales.   GI: Soft. Bowel sounds are normal. There is no abdominal tenderness.  Musculoskeletal:     Right shoulder: She exhibits no swelling.  Lymphadenopathy:    She has no cervical adenopathy.  Neurological: She is alert and oriented to person, place, and time. No cranial nerve deficit.  Skin: Skin is warm. No rash noted. Nails show no clubbing.  Psychiatric: She has a normal mood and affect.      Data Reviewed: Basic Metabolic Panel: Recent Labs  Lab 01/24/19 1513 01/25/19 0523  NA 137 135  K 3.9 4.0  CL 104 104  CO2 21* 19*  GLUCOSE 101* 163*  BUN 12 13  CREATININE 0.64 0.81  CALCIUM 8.9 8.9    CBC: Recent Labs  Lab 01/24/19 1513 01/25/19 0523  WBC 7.1 5.9  NEUTROABS 3.5  --   HGB 11.6* 12.0  HCT 36.7 37.4  MCV 80.1 79.4*  PLT 277 306     Studies: Ct Head Wo Contrast  Result Date: 01/24/2019 CLINICAL DATA:  Left side headache for 6 days with gradual onset of vision loss in the left eye. EXAM: CT HEAD WITHOUT CONTRAST TECHNIQUE: Contiguous axial images were obtained from the base of the skull through the vertex without intravenous contrast. COMPARISON:  None. FINDINGS: Brain: No evidence of acute infarction, hemorrhage, hydrocephalus, extra-axial collection or mass lesion/mass effect. Vascular: No hyperdense vessel or unexpected calcification. Skull: Normal. Negative for fracture or focal lesion. Sinuses/Orbits: Negative. Other: None. IMPRESSION: Normal head CT. Electronically Signed  By: Inge Rise M.D.   On: 01/24/2019 15:48   Mr Brain W And Wo Contrast  Result Date: 01/24/2019 CLINICAL DATA:  Neuro deficit(s), headache, basilar or orbital headache, visual loss, left afferent pupillary defect. Additional history provided: Patient reports headache on left side of head for 6 days and gradual loss of vision in left eye. History of CVA in 2001. EXAM: MRI HEAD AND ORBITS WITHOUT AND WITH CONTRAST TECHNIQUE: Multiplanar, multiecho pulse sequences of the brain and  surrounding structures were obtained without and with intravenous contrast. Multiplanar, multiecho pulse sequences of the orbits and surrounding structures were obtained including fat saturation techniques, before and after intravenous contrast administration. CONTRAST:  6mL GADAVIST GADOBUTROL 1 MMOL/ML IV SOLN COMPARISON:  Head CT 01/24/2019 FINDINGS: MRI HEAD FINDINGS Brain: There is no convincing evidence of acute infarct. No evidence of intracranial mass. No midline shift or extra-axial fluid collection. No chronic intracranial blood products. No focal parenchymal signal abnormality. Cerebral volume is normal for age. No abnormal intracranial enhancement is identified. Vascular: Flow voids maintained within the proximal large arterial vessels. Skull and upper cervical spine: No focal marrow lesion MRI ORBITS FINDINGS Orbits: There is abnormal T2 hyperintensity and swelling of the intracanalicular and intraorbital left optic nerve. Associated abnormal enhancement of the intracanalicular and intraorbital left optic nerve sheath. Additionally, there is abnormal enhancement of the intraorbital left optic nerve distally. There is apparent increased DWI signal within the intraorbital left optic nerve on concurrent brain MRI, although no definite corresponding ADC hypointensity is identified. The globes are normal in size and contour. The extraocular muscles are grossly symmetric. The suprasellar cistern is patent. No mass effect upon the optic chiasm. Visualized sinuses: Mild scattered paranasal sinus mucosal thickening. Frothy secretions within right-sided ethmoid air cells. Small right sphenoid sinus air-fluid level. Soft tissues: Negative Limited intracranial: Reported above under the MRI brain findings section. These results were called by telephone at the time of interpretation on 01/24/2019 at 6:55 pm to provider Duffy Bruce , who verbally acknowledged these results. IMPRESSION: MRI orbits: 1. T2 hyperintense  signal abnormality and swelling of the intracanalicular and intraorbital left optic nerve. Associated abnormal enhancement of the intracanalicular and intraorbital left optic nerve sheath, as well as abnormal enhancement of the intraorbital left optic nerve distally. Findings are consistent with optic neuritis. 2. Paranasal sinus disease as described. This includes frothy secretions within right ethmoid air cells and a small right sphenoid sinus air-fluid level. Correlate for acute sinusitis. MRI head: Unremarkable MRI appearance of the brain for age. Electronically Signed   By: Kellie Simmering   On: 01/24/2019 18:56   Mr Rosealee Albee Wo Contrast  Result Date: 01/24/2019 CLINICAL DATA:  Neuro deficit(s), headache, basilar or orbital headache, visual loss, left afferent pupillary defect. Additional history provided: Patient reports headache on left side of head for 6 days and gradual loss of vision in left eye. History of CVA in 2001. EXAM: MRI HEAD AND ORBITS WITHOUT AND WITH CONTRAST TECHNIQUE: Multiplanar, multiecho pulse sequences of the brain and surrounding structures were obtained without and with intravenous contrast. Multiplanar, multiecho pulse sequences of the orbits and surrounding structures were obtained including fat saturation techniques, before and after intravenous contrast administration. CONTRAST:  16mL GADAVIST GADOBUTROL 1 MMOL/ML IV SOLN COMPARISON:  Head CT 01/24/2019 FINDINGS: MRI HEAD FINDINGS Brain: There is no convincing evidence of acute infarct. No evidence of intracranial mass. No midline shift or extra-axial fluid collection. No chronic intracranial blood products. No focal parenchymal signal abnormality. Cerebral  volume is normal for age. No abnormal intracranial enhancement is identified. Vascular: Flow voids maintained within the proximal large arterial vessels. Skull and upper cervical spine: No focal marrow lesion MRI ORBITS FINDINGS Orbits: There is abnormal T2 hyperintensity and  swelling of the intracanalicular and intraorbital left optic nerve. Associated abnormal enhancement of the intracanalicular and intraorbital left optic nerve sheath. Additionally, there is abnormal enhancement of the intraorbital left optic nerve distally. There is apparent increased DWI signal within the intraorbital left optic nerve on concurrent brain MRI, although no definite corresponding ADC hypointensity is identified. The globes are normal in size and contour. The extraocular muscles are grossly symmetric. The suprasellar cistern is patent. No mass effect upon the optic chiasm. Visualized sinuses: Mild scattered paranasal sinus mucosal thickening. Frothy secretions within right-sided ethmoid air cells. Small right sphenoid sinus air-fluid level. Soft tissues: Negative Limited intracranial: Reported above under the MRI brain findings section. These results were called by telephone at the time of interpretation on 01/24/2019 at 6:55 pm to provider Shaune PollackAMERON ISAACS , who verbally acknowledged these results. IMPRESSION: MRI orbits: 1. T2 hyperintense signal abnormality and swelling of the intracanalicular and intraorbital left optic nerve. Associated abnormal enhancement of the intracanalicular and intraorbital left optic nerve sheath, as well as abnormal enhancement of the intraorbital left optic nerve distally. Findings are consistent with optic neuritis. 2. Paranasal sinus disease as described. This includes frothy secretions within right ethmoid air cells and a small right sphenoid sinus air-fluid level. Correlate for acute sinusitis. MRI head: Unremarkable MRI appearance of the brain for age. Electronically Signed   By: Jackey LogeKyle  Golden   On: 01/24/2019 18:56    Scheduled Meds: . heparin  5,000 Units Subcutaneous Q8H  . pantoprazole  40 mg Oral Daily   Continuous Infusions: . methylPREDNISolone (SOLU-MEDROL) injection      Assessment/Plan:  1. Optic neuritis of the left eye with vision loss and eye  pain.  Appreciate neurology consultation and high-dose Solu-Medrol for 5 days.  Case discussed with Dr. Brooke DareKing ophthalmology and he will evaluate the patient and then follow-up the patient as outpatient.  Neurology ordered a MRI of the cervical spine.  MRI of the brain did not show any other lesions suspicious for MS. Send off an ANA. 2. GERD on Protonix  Code Status:     Code Status Orders  (From admission, onward)         Start     Ordered   01/24/19 2217  Full code  Continuous     01/24/19 2217        Code Status History    This patient has a current code status but no historical code status.   Advance Care Planning Activity     Family Communication: Spoke with son on the phone Disposition Plan: We will need 4 more evenings of steroids.  Consultants:  Neurology  Ophthalmology  Time spent: 28 minutes through translator  [employer] Corporation[employer] Dorris Pierre  [facility]

## 2019-01-25 NOTE — Plan of Care (Signed)

## 2019-01-25 NOTE — Consult Note (Signed)
Reason for Consult: L eye vision loss with pain.  Referring Physician: Dr. Renae GlossWieting  CC: L eye pain with vision loss   HPI: Chelsea Higgins is an 43 y.o. female with a known history of gastroesophageal reflux disease, gestational diabetes, stroke-started having headache and loss of vision from left eye for last 6 days that is painful.  This is persistence and not going away so decided to come to emergency room.  On further questioning she says that she sees some bright lights and some grayish color to her left eye but cannot see clear. She denies any associated focal neurological symptoms like weakness, tingling, numbness.  MRI orbits consistent with L optic neuritis.    Past Medical History:  Diagnosis Date  . GERD (gastroesophageal reflux disease)   . Gestational diabetes   . Stroke [facility](HCC)     Past Surgical History:  Procedure Laterality Date  . COLPOSCOPY  02/27/2016   Colpo bx results: Transformation zone tissue with no dysplasia or carcinoma identified    Family History  Problem Relation Age of Onset  . Diabetes Mother     Social History:  reports that she has never smoked. She has never used smokeless tobacco. She reports that she does not drink alcohol or use drugs.  No Known Allergies  Medications: I have reviewed the patient's current medications.  ROS: History obtained from the patient  General ROS: negative for - chills, fatigue, fever, night sweats, weight gain or weight loss Psychological ROS: negative for - behavioral disorder, hallucinations, memory difficulties, mood swings or suicidal ideation Ophthalmic ROS: negative for - blurry vision, double vision, eye pain or loss of vision ENT ROS: negative for - epistaxis, nasal discharge, oral lesions, sore throat, tinnitus or vertigo Allergy and Immunology ROS: negative for - hives or itchy/watery eyes Hematological and Lymphatic ROS: negative for - bleeding problems, bruising or swollen lymph  nodes Endocrine ROS: negative for - galactorrhea, hair pattern changes, polydipsia/polyuria or temperature intolerance Respiratory ROS: negative for - cough, hemoptysis, shortness of breath or wheezing Cardiovascular ROS: negative for - chest pain, dyspnea on exertion, edema or irregular heartbeat Gastrointestinal ROS: negative for - abdominal pain, diarrhea, hematemesis, nausea/vomiting or stool incontinence Genito-Urinary ROS: negative for - dysuria, hematuria, incontinence or urinary frequency/urgency Musculoskeletal ROS: negative for - joint swelling or muscular weakness Neurological ROS: as noted in HPI Dermatological ROS: negative for rash and skin lesion changes  Physical Examination: Blood pressure 102/70, pulse 80, temperature 98.1 F (36.7 C), resp. rate 18, height 5\' 4"  (1.626 m), weight 80 kg, last menstrual period 01/04/2019, SpO2 99 %.   Neurological Examination   Mental Status: Alert, oriented, thought content appropriate.  Speech fluent without evidence of aphasia.  Able to follow 3 step commands without difficulty. Cranial Nerves: II: Sees grayish images L eye but cant tell me how many fingers I am showing her on L eye.  III,IV, VI: ptosis not present, extra-ocular motions intact bilaterally V,VII: smile symmetric, facial light touch sensation normal bilaterally VIII: hearing normal bilaterally IX,X: gag reflex present XI: bilateral shoulder shrug XII: midline tongue extension Motor: Right : Upper extremity   5/5    Left:     Upper extremity   5/5  Lower extremity   5/5     Lower extremity   5/5 Tone and bulk:normal tone throughout; no atrophy noted Sensory: Pinprick and light touch intact throughout, bilaterally Deep Tendon Reflexes: 2+ and symmetric throughout Plantars: Right: downgoing   Left: downgoing Cerebellar: normal finger-to-nose, normal  rapid alternating movements and normal heel-to-shin test Gait: not tested      Laboratory Studies:   Basic  Metabolic Panel: Recent Labs  Lab 01/24/19 1513 01/25/19 0523  NA 137 135  K 3.9 4.0  CL 104 104  CO2 21* 19*  GLUCOSE 101* 163*  BUN 12 13  CREATININE 0.64 0.81  CALCIUM 8.9 8.9    Liver Function Tests: No results for input(s): AST, ALT, ALKPHOS, BILITOT, PROT, ALBUMIN in the last 168 hours. No results for input(s): LIPASE, AMYLASE in the last 168 hours. No results for input(s): AMMONIA in the last 168 hours.  CBC: Recent Labs  Lab 01/24/19 1513 01/25/19 0523  WBC 7.1 5.9  NEUTROABS 3.5  --   HGB 11.6* 12.0  HCT 36.7 37.4  MCV 80.1 79.4*  PLT 277 306    Cardiac Enzymes: No results for input(s): CKTOTAL, CKMB, CKMBINDEX, TROPONINI in the last 168 hours.  BNP: Invalid input(s): POCBNP  CBG: No results for input(s): GLUCAP in the last 168 hours.  Microbiology: No results found for this or any previous visit.  Coagulation Studies: No results for input(s): LABPROT, INR in the last 72 hours.  Urinalysis:  Recent Labs  Lab 01/24/19 1513  COLORURINE YELLOW*  LABSPEC 1.010  PHURINE 7.0  GLUCOSEU NEGATIVE  HGBUR NEGATIVE  BILIRUBINUR NEGATIVE  KETONESUR NEGATIVE  PROTEINUR NEGATIVE  NITRITE NEGATIVE  LEUKOCYTESUR LARGE*    Lipid Panel:  No results found for: CHOL, TRIG, HDL, CHOLHDL, VLDL, LDLCALC  HgbA1C: No results found for: HGBA1C  Urine Drug Screen:  No results found for: LABOPIA, COCAINSCRNUR, LABBENZ, AMPHETMU, THCU, LABBARB  Alcohol Level: No results for input(s): ETH in the last 168 hours.  Other results: EKG: normal EKG, normal sinus rhythm, unchanged from previous tracings.  Imaging: Ct Head Wo Contrast  Result Date: 01/24/2019 CLINICAL DATA:  Left side headache for 6 days with gradual onset of vision loss in the left eye. EXAM: CT HEAD WITHOUT CONTRAST TECHNIQUE: Contiguous axial images were obtained from the base of the skull through the vertex without intravenous contrast. COMPARISON:  None. FINDINGS: Brain: No evidence of acute  infarction, hemorrhage, hydrocephalus, extra-axial collection or mass lesion/mass effect. Vascular: No hyperdense vessel or unexpected calcification. Skull: Normal. Negative for fracture or focal lesion. Sinuses/Orbits: Negative. Other: None. IMPRESSION: Normal head CT. Electronically Signed   By: Inge Rise M.D.   On: 01/24/2019 15:48   Mr Brain W And Wo Contrast  Result Date: 01/24/2019 CLINICAL DATA:  Neuro deficit(s), headache, basilar or orbital headache, visual loss, left afferent pupillary defect. Additional history provided: Patient reports headache on left side of head for 6 days and gradual loss of vision in left eye. History of CVA in 2001. EXAM: MRI HEAD AND ORBITS WITHOUT AND WITH CONTRAST TECHNIQUE: Multiplanar, multiecho pulse sequences of the brain and surrounding structures were obtained without and with intravenous contrast. Multiplanar, multiecho pulse sequences of the orbits and surrounding structures were obtained including fat saturation techniques, before and after intravenous contrast administration. CONTRAST:  35mL GADAVIST GADOBUTROL 1 MMOL/ML IV SOLN COMPARISON:  Head CT 01/24/2019 FINDINGS: MRI HEAD FINDINGS Brain: There is no convincing evidence of acute infarct. No evidence of intracranial mass. No midline shift or extra-axial fluid collection. No chronic intracranial blood products. No focal parenchymal signal abnormality. Cerebral volume is normal for age. No abnormal intracranial enhancement is identified. Vascular: Flow voids maintained within the proximal large arterial vessels. Skull and upper cervical spine: No focal marrow lesion MRI ORBITS FINDINGS Orbits:  There is abnormal T2 hyperintensity and swelling of the intracanalicular and intraorbital left optic nerve. Associated abnormal enhancement of the intracanalicular and intraorbital left optic nerve sheath. Additionally, there is abnormal enhancement of the intraorbital left optic nerve distally. There is apparent  increased DWI signal within the intraorbital left optic nerve on concurrent brain MRI, although no definite corresponding ADC hypointensity is identified. The globes are normal in size and contour. The extraocular muscles are grossly symmetric. The suprasellar cistern is patent. No mass effect upon the optic chiasm. Visualized sinuses: Mild scattered paranasal sinus mucosal thickening. Frothy secretions within right-sided ethmoid air cells. Small right sphenoid sinus air-fluid level. Soft tissues: Negative Limited intracranial: Reported above under the MRI brain findings section. These results were called by telephone at the time of interpretation on 01/24/2019 at 6:55 pm to provider Shaune Pollack , who verbally acknowledged these results. IMPRESSION: MRI orbits: 1. T2 hyperintense signal abnormality and swelling of the intracanalicular and intraorbital left optic nerve. Associated abnormal enhancement of the intracanalicular and intraorbital left optic nerve sheath, as well as abnormal enhancement of the intraorbital left optic nerve distally. Findings are consistent with optic neuritis. 2. Paranasal sinus disease as described. This includes frothy secretions within right ethmoid air cells and a small right sphenoid sinus air-fluid level. Correlate for acute sinusitis. MRI head: Unremarkable MRI appearance of the brain for age. Electronically Signed   By: Jackey Loge   On: 01/24/2019 18:56   Mr [place] Wo Contrast  Result Date: 01/24/2019 CLINICAL DATA:  Neuro deficit(s), headache, basilar or orbital headache, visual loss, left afferent pupillary defect. Additional history provided: Patient reports headache on left side of head for 6 days and gradual loss of vision in left eye. History of CVA in 2001. EXAM: MRI HEAD AND ORBITS WITHOUT AND WITH CONTRAST TECHNIQUE: Multiplanar, multiecho pulse sequences of the brain and surrounding structures were obtained without and with intravenous contrast. Multiplanar,  multiecho pulse sequences of the orbits and surrounding structures were obtained including fat saturation techniques, before and after intravenous contrast administration. CONTRAST:  7mL GADAVIST GADOBUTROL 1 MMOL/ML IV SOLN COMPARISON:  Head CT 01/24/2019 FINDINGS: MRI HEAD FINDINGS Brain: There is no convincing evidence of acute infarct. No evidence of intracranial mass. No midline shift or extra-axial fluid collection. No chronic intracranial blood products. No focal parenchymal signal abnormality. Cerebral volume is normal for age. No abnormal intracranial enhancement is identified. Vascular: Flow voids maintained within the proximal large arterial vessels. Skull and upper cervical spine: No focal marrow lesion MRI ORBITS FINDINGS Orbits: There is abnormal T2 hyperintensity and swelling of the intracanalicular and intraorbital left optic nerve. Associated abnormal enhancement of the intracanalicular and intraorbital left optic nerve sheath. Additionally, there is abnormal enhancement of the intraorbital left optic nerve distally. There is apparent increased DWI signal within the intraorbital left optic nerve on concurrent brain MRI, although no definite corresponding ADC hypointensity is identified. The globes are normal in size and contour. The extraocular muscles are grossly symmetric. The suprasellar cistern is patent. No mass effect upon the optic chiasm. Visualized sinuses: Mild scattered paranasal sinus mucosal thickening. Frothy secretions within right-sided ethmoid air cells. Small right sphenoid sinus air-fluid level. Soft tissues: Negative Limited intracranial: Reported above under the MRI brain findings section. These results were called by telephone at the time of interpretation on 01/24/2019 at 6:55 pm to provider Shaune Pollack , who verbally acknowledged these results. IMPRESSION: MRI orbits: 1. T2 hyperintense signal abnormality and swelling of the intracanalicular and intraorbital  left optic  nerve. Associated abnormal enhancement of the intracanalicular and intraorbital left optic nerve sheath, as well as abnormal enhancement of the intraorbital left optic nerve distally. Findings are consistent with optic neuritis. 2. Paranasal sinus disease as described. This includes frothy secretions within right ethmoid air cells and a small right sphenoid sinus air-fluid level. Correlate for acute sinusitis. MRI head: Unremarkable MRI appearance of the brain for age. Electronically Signed   By: Jackey Loge   On: 01/24/2019 18:56     Assessment/Plan:  43 y.o. female with a known history of gastroesophageal reflux disease, gestational diabetes, stroke-started having headache and loss of vision from left eye for last 6 days that is painful.  This is persistence and not going away so decided to come to emergency room.  On further questioning she says that she sees some bright lights and some grayish color to her left eye but cannot see clear. She denies any associated focal neurological symptoms like weakness, tingling, numbness.  MRI orbits consistent with L optic neuritis.    - MRI brain with and without contrast no acute abnormalities. No chronic lesions that would point to history of multiple sclerosis - MRI orbits L optic nerve swelling consistent with optic neuritis - Agree with solumedrol 1g daily for 5 days - glucose surveillance - MRI C spine ordered with and without contrast as some patients present with Devic's( NMO) that is associated with optic neuritis and early cervical lesion - Don't think needs and LP yet as unless active lesions on C spine.    01/25/2019, 12:04 PM

## 2019-01-25 NOTE — Consult Note (Signed)
Reason for Consult: optic neuritis, left eye Referring Physician:  Dr. Leslye Peer Chief complaint: blurry vision, left eye  HPI: Chelsea Higgins is an 43 y.o. female with a 6 day history of left sided headaches, blurry vision and pain with eye movement.  She reports that the headache started about a week ago and 4 days ago vision in the left eye got significantly worse.   She reports eye pain worse with movement, but this has improved significantly since starting IV steroids today. She denies double vision. History and exam were obtained with assistance from a spanish interpreter Ronnald Collum.  Past Medical History:  Diagnosis Date  . GERD (gastroesophageal reflux disease)   . Gestational diabetes   . Stroke ([place])     ROS  Past Surgical History:  Procedure Laterality Date  . COLPOSCOPY  02/27/2016   Colpo bx results: Transformation zone tissue with no dysplasia or carcinoma identified    Family History  Problem Relation Age of Onset  . Diabetes Mother     Social History:  reports that she has never smoked. She has never used smokeless tobacco. She reports that she does not drink alcohol or use drugs.  Allergies: No Known Allergies  Prior to Admission medications   Medication Sig Start Date End Date Taking? Authorizing Provider  PARAGARD INTRAUTERINE COPPER IU by Intrauterine route. 05/19/18 05/19/28  Jerene Dilling, PA    Results for orders placed or performed during the hospital encounter of 01/24/19 (from the past 48 hour(s))  CBC with Differential     Status: Abnormal   Collection Time: 01/24/19  3:13 PM  Result Value Ref Range   WBC 7.1 4.0 - 10.5 K/uL   RBC 4.58 3.87 - 5.11 MIL/uL   Hemoglobin 11.6 (L) 12.0 - 15.0 g/dL   HCT 36.7 36.0 - 46.0 %   MCV 80.1 80.0 - 100.0 fL   MCH 25.3 (L) 26.0 - 34.0 pg   MCHC 31.6 30.0 - 36.0 g/dL   RDW 16.1 (H) 11.5 - 15.5 %   Platelets 277 150 - 400 K/uL   nRBC 0.0 0.0 - 0.2 %   Neutrophils Relative % 49 %   Neutro Abs 3.5  1.7 - 7.7 K/uL   Lymphocytes Relative 36 %   Lymphs Abs 2.6 0.7 - 4.0 K/uL   Monocytes Relative 12 %   Monocytes Absolute 0.8 0.1 - 1.0 K/uL   Eosinophils Relative 2 %   Eosinophils Absolute 0.1 0.0 - 0.5 K/uL   Basophils Relative 1 %   Basophils Absolute 0.1 0.0 - 0.1 K/uL   Immature Granulocytes 0 %   Abs Immature Granulocytes 0.01 0.00 - 0.07 K/uL    Comment: Performed at [facility], [address] [place], [place] [address]  Basic metabolic panel     Status: Abnormal   Collection Time: 01/24/19  3:13 PM  Result Value Ref Range   Sodium 137 135 - 145 mmol/L   Potassium 3.9 3.5 - 5.1 mmol/L   Chloride 104 98 - 111 mmol/L   CO2 21 (L) 22 - 32 mmol/L   Glucose, Bld 101 (H) 70 - 99 mg/dL   BUN 12 6 - 20 mg/dL   Creatinine, Ser 0.64 0.44 - 1.00 mg/dL   Calcium 8.9 8.9 - 10.3 mg/dL   GFR calc non Af Amer >60 >60 mL/min   GFR calc Af Amer >60 >60 mL/min   Anion gap 12 5 - 15    Comment: Performed at [facility],  [address] [place], [place] [address]  Urinalysis, Complete w Microscopic     Status: Abnormal   Collection Time: 01/24/19  3:13 PM  Result Value Ref Range   Color, Urine YELLOW (A) YELLOW   APPearance HAZY (A) CLEAR   Specific Gravity, Urine 1.010 1.005 - 1.030   pH 7.0 5.0 - 8.0   Glucose, UA NEGATIVE NEGATIVE mg/dL   Hgb urine dipstick NEGATIVE NEGATIVE   Bilirubin Urine NEGATIVE NEGATIVE   Ketones, ur NEGATIVE NEGATIVE mg/dL   Protein, ur NEGATIVE NEGATIVE mg/dL   Nitrite NEGATIVE NEGATIVE   Leukocytes,Ua LARGE (A) NEGATIVE   RBC / HPF 0-5 0 - 5 RBC/hpf   WBC, UA 11-20 0 - 5 WBC/hpf   Bacteria, UA NONE SEEN NONE SEEN   Squamous Epithelial / LPF 11-20 0 - 5    Comment: Performed at [facility], [address] [place], [place] [address]  Pregnancy, urine POC     Status: None   Collection Time: 01/24/19  4:03 PM  Result Value Ref Range   Preg Test, Ur NEGATIVE NEGATIVE    Comment:        THE SENSITIVITY OF  THIS METHODOLOGY IS >24 mIU/mL   RPR     Status: None (Preliminary result)   Collection Time: 01/24/19  4:32 PM  Result Value Ref Range   RPR Ser Ql PENDING NON REACTIVE  HIV antibody     Status: None   Collection Time: 01/24/19  4:32 PM  Result Value Ref Range   HIV Screen 4th Generation wRfx Non Reactive Non Reactive    Comment: (NOTE) Performed At: [facility] [address] [place], [place] [address] Jolene SchimkeNagendra Sanjai MD WU:[id number]:[phone]   Sedimentation rate     Status: Abnormal   Collection Time: 01/24/19 10:40 PM  Result Value Ref Range   Sed Rate 36 (H) 0 - 20 mm/hr    Comment: Performed at [facility], [address] [place], [place] [address]  C-reactive protein     Status: Abnormal   Collection Time: 01/24/19 10:40 PM  Result Value Ref Range   CRP 1.6 (H) <1.0 mg/dL    Comment: Performed at [facility], [address] [place], [place] [address]  Basic metabolic panel     Status: Abnormal   Collection Time: 01/25/19  5:23 AM  Result Value Ref Range   Sodium 135 135 - 145 mmol/L   Potassium 4.0 3.5 - 5.1 mmol/L   Chloride 104 98 - 111 mmol/L   CO2 19 (L) 22 - 32 mmol/L   Glucose, Bld 163 (H) 70 - 99 mg/dL   BUN 13 6 - 20 mg/dL   Creatinine, Ser 7.420.81 0.44 - 1.00 mg/dL   Calcium 8.9 8.9 - 59.510.3 mg/dL   GFR calc non Af Amer >60 >60 mL/min   GFR calc Af Amer >60 >60 mL/min   Anion gap 12 5 - 15    Comment: Performed at [facility], [address] [place], [place] [address]  CBC     Status: Abnormal   Collection Time: 01/25/19  5:23 AM  Result Value Ref Range   WBC 5.9 4.0 - 10.5 K/uL   RBC 4.71 3.87 - 5.11 MIL/uL   Hemoglobin 12.0 12.0 - 15.0 g/dL   HCT 64.337.4 32.936.0 - 51.846.0 %   MCV 79.4 (L) 80.0 - 100.0 fL   MCH 25.5 (L) 26.0 - 34.0 pg   MCHC 32.1 30.0 - 36.0 g/dL  RDW 16.1 (H) 11.5 - 15.5 %   Platelets 306 150 - 400 K/uL   nRBC 0.0 0.0 - 0.2 %    Comment: Performed at [facility], [address] [place],  [place] [address]    Ct Head Wo Contrast  Result Date: 01/24/2019 CLINICAL DATA:  Left side headache for 6 days with gradual onset of vision loss in the left eye. EXAM: CT HEAD WITHOUT CONTRAST TECHNIQUE: Contiguous axial images were obtained from the base of the skull through the vertex without intravenous contrast. COMPARISON:  None. FINDINGS: Brain: No evidence of acute infarction, hemorrhage, hydrocephalus, extra-axial collection or mass lesion/mass effect. Vascular: No hyperdense vessel or unexpected calcification. Skull: Normal. Negative for fracture or focal lesion. Sinuses/Orbits: Negative. Other: None. IMPRESSION: Normal head CT. Electronically Signed   By: Drusilla Kanner M.D.   On: 01/24/2019 15:48   Mr Brain W And Wo Contrast  Result Date: 01/24/2019 CLINICAL DATA:  Neuro deficit(s), headache, basilar or orbital headache, visual loss, left afferent pupillary defect. Additional history provided: Patient reports headache on left side of head for 6 days and gradual loss of vision in left eye. History of CVA in 2001. EXAM: MRI HEAD AND ORBITS WITHOUT AND WITH CONTRAST TECHNIQUE: Multiplanar, multiecho pulse sequences of the brain and surrounding structures were obtained without and with intravenous contrast. Multiplanar, multiecho pulse sequences of the orbits and surrounding structures were obtained including fat saturation techniques, before and after intravenous contrast administration. CONTRAST:  7mL GADAVIST GADOBUTROL 1 MMOL/ML IV SOLN COMPARISON:  Head CT 01/24/2019 FINDINGS: MRI HEAD FINDINGS Brain: There is no convincing evidence of acute infarct. No evidence of intracranial mass. No midline shift or extra-axial fluid collection. No chronic intracranial blood products. No focal parenchymal signal abnormality. Cerebral volume is normal for age. No abnormal intracranial enhancement is identified. Vascular: Flow voids maintained within the proximal large arterial vessels. Skull and upper cervical  spine: No focal marrow lesion MRI ORBITS FINDINGS Orbits: There is abnormal T2 hyperintensity and swelling of the intracanalicular and intraorbital left optic nerve. Associated abnormal enhancement of the intracanalicular and intraorbital left optic nerve sheath. Additionally, there is abnormal enhancement of the intraorbital left optic nerve distally. There is apparent increased DWI signal within the intraorbital left optic nerve on concurrent brain MRI, although no definite corresponding ADC hypointensity is identified. The globes are normal in size and contour. The extraocular muscles are grossly symmetric. The suprasellar cistern is patent. No mass effect upon the optic chiasm. Visualized sinuses: Mild scattered paranasal sinus mucosal thickening. Frothy secretions within right-sided ethmoid air cells. Small right sphenoid sinus air-fluid level. Soft tissues: Negative Limited intracranial: Reported above under the MRI brain findings section. These results were called by telephone at the time of interpretation on 01/24/2019 at 6:55 pm to provider Shaune Pollack , who verbally acknowledged these results. IMPRESSION: MRI orbits: 1. T2 hyperintense signal abnormality and swelling of the intracanalicular and intraorbital left optic nerve. Associated abnormal enhancement of the intracanalicular and intraorbital left optic nerve sheath, as well as abnormal enhancement of the intraorbital left optic nerve distally. Findings are consistent with optic neuritis. 2. Paranasal sinus disease as described. This includes frothy secretions within right ethmoid air cells and a small right sphenoid sinus air-fluid level. Correlate for acute sinusitis. MRI head: Unremarkable MRI appearance of the brain for age. Electronically Signed   By: Jackey Loge   On: 01/24/2019 18:56   Mr [place] Wo Contrast  Result Date: 01/24/2019 CLINICAL DATA:  Neuro deficit(s),  headache, basilar or orbital headache, visual loss, left afferent  pupillary defect. Additional history provided: Patient reports headache on left side of head for 6 days and gradual loss of vision in left eye. History of CVA in 2001. EXAM: MRI HEAD AND ORBITS WITHOUT AND WITH CONTRAST TECHNIQUE: Multiplanar, multiecho pulse sequences of the brain and surrounding structures were obtained without and with intravenous contrast. Multiplanar, multiecho pulse sequences of the orbits and surrounding structures were obtained including fat saturation techniques, before and after intravenous contrast administration. CONTRAST:  95mL GADAVIST GADOBUTROL 1 MMOL/ML IV SOLN COMPARISON:  Head CT 01/24/2019 FINDINGS: MRI HEAD FINDINGS Brain: There is no convincing evidence of acute infarct. No evidence of intracranial mass. No midline shift or extra-axial fluid collection. No chronic intracranial blood products. No focal parenchymal signal abnormality. Cerebral volume is normal for age. No abnormal intracranial enhancement is identified. Vascular: Flow voids maintained within the proximal large arterial vessels. Skull and upper cervical spine: No focal marrow lesion MRI ORBITS FINDINGS Orbits: There is abnormal T2 hyperintensity and swelling of the intracanalicular and intraorbital left optic nerve. Associated abnormal enhancement of the intracanalicular and intraorbital left optic nerve sheath. Additionally, there is abnormal enhancement of the intraorbital left optic nerve distally. There is apparent increased DWI signal within the intraorbital left optic nerve on concurrent brain MRI, although no definite corresponding ADC hypointensity is identified. The globes are normal in size and contour. The extraocular muscles are grossly symmetric. The suprasellar cistern is patent. No mass effect upon the optic chiasm. Visualized sinuses: Mild scattered paranasal sinus mucosal thickening. Frothy secretions within right-sided ethmoid air cells. Small right sphenoid sinus air-fluid level. Soft tissues:  Negative Limited intracranial: Reported above under the MRI brain findings section. These results were called by telephone at the time of interpretation on 01/24/2019 at 6:55 pm to provider Shaune Pollack , who verbally acknowledged these results. IMPRESSION: MRI orbits: 1. T2 hyperintense signal abnormality and swelling of the intracanalicular and intraorbital left optic nerve. Associated abnormal enhancement of the intracanalicular and intraorbital left optic nerve sheath, as well as abnormal enhancement of the intraorbital left optic nerve distally. Findings are consistent with optic neuritis. 2. Paranasal sinus disease as described. This includes frothy secretions within right ethmoid air cells and a small right sphenoid sinus air-fluid level. Correlate for acute sinusitis. MRI head: Unremarkable MRI appearance of the brain for age. Electronically Signed   By: Jackey Loge   On: 01/24/2019 18:56    Blood pressure 102/70, pulse 80, temperature 98.1 F (36.7 C), resp. rate 18, height 5\' 4"  (1.626 m), weight 80 kg, last menstrual period 01/04/2019, SpO2 99 %.  Mental status: Alert and Oriented x 4  Visual Acuity:  20/30 OD  , count fingers OS near [place]  Pupils:  Round, reactive to light.  4+ "barn door" relative afferent pupillary defect in the left eye.\  Motility:  Full/ orthophoric  Visual Fields:  Full to confrontation grossly, but difficult in the left eye in all quadrants.  IOP:  19 OD, 19 OS by tonopen.  External/ Lids/ Lashes:  Normal  Anterior Segment:  Conjunctiva:  Normal  OU  Cornea:  Normal  OU  Anterior Chamber: Normal  OU  Lens:   Normal OU  Posterior Segment: Dilated OU with 1% Tropicamide and 2.5% Phenylephrine  Discs:   Normal c/d ratio 0.3, no pallor, no edema OU  Macula:  Normal  Vessels/ Periphery: Normal    Assessment/Plan:  1.  Optic neuritis, left eye. Confirmed by  MRI and large afferent pupillary defect with otherwise normal eye exam.  Normal appearing optic  nerve head (retrobulbar optic neuritis).  Good prognosis for visual recovery within 6 months.  Agree with high dose IV steroids with taper by mouth.  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  Followup with [facility] in one month for additional outpatient testing (visual field, color vision, optic nerve optical coherence tomography).  Signing off.  Please call with additional questions or concerns.    Willey Blade 01/25/2019, 12:41 PM

## 2019-01-26 LAB — RPR: RPR Ser Ql: NONREACTIVE — AB

## 2019-01-26 LAB — HEMOGLOBIN A1C
Hgb A1c MFr Bld: 5.8 % — ABNORMAL HIGH (ref 4.8–5.6)
Mean Plasma Glucose: 119.76 mg/dL

## 2019-01-26 LAB — ANA W/REFLEX IF POSITIVE: Anti Nuclear Antibody (ANA): NEGATIVE

## 2019-01-26 LAB — GLUCOSE, CAPILLARY
Glucose-Capillary: 141 mg/dL — ABNORMAL HIGH (ref 70–99)
Glucose-Capillary: 138 mg/dL — ABNORMAL HIGH (ref 70–99)

## 2019-01-26 MED ORDER — INSULIN ASPART 100 UNIT/ML ~~LOC~~ SOLN
0.0000 [IU] | Freq: Three times a day (TID) | SUBCUTANEOUS | Status: DC
  Administered 2019-01-26 – 2019-01-27 (×2): 1 [IU] via SUBCUTANEOUS
  Administered 2019-01-27: 17:00:00 2 [IU] via SUBCUTANEOUS
  Administered 2019-01-27: 12:00:00 5 [IU] via SUBCUTANEOUS
  Administered 2019-01-28: 1 [IU] via SUBCUTANEOUS
  Administered 2019-01-28: 2 [IU] via SUBCUTANEOUS
  Administered 2019-01-28 – 2019-01-29 (×2): 1 [IU] via SUBCUTANEOUS
  Filled 2019-01-26 (×9): qty 1

## 2019-01-26 MED ORDER — OXYCODONE HCL 5 MG PO TABS
5.0000 mg | ORAL_TABLET | ORAL | Status: DC | PRN
  Administered 2019-01-26 – 2019-01-27 (×2): 5 mg via ORAL
  Filled 2019-01-26 (×2): qty 1

## 2019-01-26 MED ORDER — INSULIN ASPART 100 UNIT/ML ~~LOC~~ SOLN: 0.0000 [IU] | Freq: Every day | SUBCUTANEOUS | Status: DC

## 2019-01-26 NOTE — Progress Notes (Addendum)
Patient ID: Chelsea Higgins, female   DOB: 05-27-1975, 43 y.o.   MRN: [id number]  [facility] PROGRESS NOTE  [name] WUX:[id number] DOB: 1976-02-25 DOA: 01/24/2019 PCP: Patient, No Pcp Per  HPI/Subjective: Patient states her vision in the left eye is still poor.  Slight little headache there.  She told me about an episode about 20 years ago where she did have her eyes cross and had muscle weakness at that time.  Things had resolved.  Objective: Vitals:   01/26/19 0501 01/26/19 0510  BP: (!) 91/47 (!) 105/59  Pulse: 65 64  Resp: 16   Temp: 98.6 F (37 C)   SpO2: 97% 96%    Intake/Output Summary (Last 24 hours) at 01/26/2019 1308 Last data filed at 01/26/2019 0900 Gross per 24 hour  Intake 587.99 ml  Output -  Net 587.99 ml   Filed Weights   01/24/19 1429 01/24/19 2218  Weight: 77.1 kg 80 kg    ROS: Review of Systems  Constitutional: Negative for chills and fever.  Eyes: Positive for pain.  Respiratory: Negative for cough and shortness of breath.   Cardiovascular: Negative for chest pain.  Gastrointestinal: Negative for abdominal pain, constipation, diarrhea, nausea and vomiting.  Genitourinary: Negative for dysuria.  Musculoskeletal: Negative for joint pain.  Neurological: Negative for dizziness and headaches.   Exam: Physical Exam  Constitutional: She is oriented to person, place, and time.  HENT:  Nose: No mucosal edema.  Mouth/Throat: No oropharyngeal exudate or posterior oropharyngeal edema.  Eyes: Pupils are equal, round, and reactive to light. Conjunctivae, EOM and lids are normal.  Patient able to see my fingers with her left eye and her right eye covered.  Yesterday she was not.  Neck: No JVD present. Carotid bruit is not present. No edema present. No thyroid mass and no thyromegaly present.  Cardiovascular: S1 normal and S2 normal. Exam reveals no gallop.  No murmur heard. Pulses:      Dorsalis pedis pulses are 2+ on the  right side and 2+ on the left side.  Respiratory: No respiratory distress. She has no wheezes. She has no rhonchi. She has no rales.  GI: Soft. Bowel sounds are normal. There is no abdominal tenderness.  Musculoskeletal:     Right ankle: She exhibits no swelling.     Left ankle: She exhibits no swelling.  Lymphadenopathy:    She has no cervical adenopathy.  Neurological: She is alert and oriented to person, place, and time. No cranial nerve deficit.  Skin: Skin is warm. No rash noted. Nails show no clubbing.  Psychiatric: She has a normal mood and affect.      Data Reviewed: Basic Metabolic Panel: Recent Labs  Lab 01/24/19 1513 01/25/19 0523  NA 137 135  K 3.9 4.0  CL 104 104  CO2 21* 19*  GLUCOSE 101* 163*  BUN 12 13  CREATININE 0.64 0.81  CALCIUM 8.9 8.9    CBC: Recent Labs  Lab 01/24/19 1513 01/25/19 0523  WBC 7.1 5.9  NEUTROABS 3.5  --   HGB 11.6* 12.0  HCT 36.7 37.4  MCV 80.1 79.4*  PLT 277 306     Studies: Ct Head Wo Contrast  Result Date: 01/24/2019 CLINICAL DATA:  Left side headache for 6 days with gradual onset of vision loss in the left eye. EXAM: CT HEAD WITHOUT CONTRAST TECHNIQUE: Contiguous axial images were obtained from the base of the skull through the vertex without intravenous contrast. COMPARISON:  None. FINDINGS: Brain: No  evidence of acute infarction, hemorrhage, hydrocephalus, extra-axial collection or mass lesion/mass effect. Vascular: No hyperdense vessel or unexpected calcification. Skull: Normal. Negative for fracture or focal lesion. Sinuses/Orbits: Negative. Other: None. IMPRESSION: Normal head CT. Electronically Signed   By: Drusilla Kannerhomas  Dalessio M.D.   On: 01/24/2019 15:48   Mr Brain W And Wo Contrast  Result Date: 01/24/2019 CLINICAL DATA:  Neuro deficit(s), headache, basilar or orbital headache, visual loss, left afferent pupillary defect. Additional history provided: Patient reports headache on left side of head for 6 days and gradual  loss of vision in left eye. History of CVA in 2001. EXAM: MRI HEAD AND ORBITS WITHOUT AND WITH CONTRAST TECHNIQUE: Multiplanar, multiecho pulse sequences of the brain and surrounding structures were obtained without and with intravenous contrast. Multiplanar, multiecho pulse sequences of the orbits and surrounding structures were obtained including fat saturation techniques, before and after intravenous contrast administration. CONTRAST:  7mL GADAVIST GADOBUTROL 1 MMOL/ML IV SOLN COMPARISON:  Head CT 01/24/2019 FINDINGS: MRI HEAD FINDINGS Brain: There is no convincing evidence of acute infarct. No evidence of intracranial mass. No midline shift or extra-axial fluid collection. No chronic intracranial blood products. No focal parenchymal signal abnormality. Cerebral volume is normal for age. No abnormal intracranial enhancement is identified. Vascular: Flow voids maintained within the proximal large arterial vessels. Skull and upper cervical spine: No focal marrow lesion MRI ORBITS FINDINGS Orbits: There is abnormal T2 hyperintensity and swelling of the intracanalicular and intraorbital left optic nerve. Associated abnormal enhancement of the intracanalicular and intraorbital left optic nerve sheath. Additionally, there is abnormal enhancement of the intraorbital left optic nerve distally. There is apparent increased DWI signal within the intraorbital left optic nerve on concurrent brain MRI, although no definite corresponding ADC hypointensity is identified. The globes are normal in size and contour. The extraocular muscles are grossly symmetric. The suprasellar cistern is patent. No mass effect upon the optic chiasm. Visualized sinuses: Mild scattered paranasal sinus mucosal thickening. Frothy secretions within right-sided ethmoid air cells. Small right sphenoid sinus air-fluid level. Soft tissues: Negative Limited intracranial: Reported above under the MRI brain findings section. These results were called by  telephone at the time of interpretation on 01/24/2019 at 6:55 pm to provider Shaune PollackAMERON ISAACS , who verbally acknowledged these results. IMPRESSION: MRI orbits: 1. T2 hyperintense signal abnormality and swelling of the intracanalicular and intraorbital left optic nerve. Associated abnormal enhancement of the intracanalicular and intraorbital left optic nerve sheath, as well as abnormal enhancement of the intraorbital left optic nerve distally. Findings are consistent with optic neuritis. 2. Paranasal sinus disease as described. This includes frothy secretions within right ethmoid air cells and a small right sphenoid sinus air-fluid level. Correlate for acute sinusitis. MRI head: Unremarkable MRI appearance of the brain for age. Electronically Signed   By: Jackey LogeKyle  Golden   On: 01/24/2019 18:56   Mr Cervical Spine W Wo Contrast  Result Date: 01/25/2019 CLINICAL DATA:  Optic neuritis. EXAM: MRI CERVICAL SPINE WITHOUT AND WITH CONTRAST TECHNIQUE: Multiplanar and multiecho pulse sequences of the cervical spine, to include the craniocervical junction and cervicothoracic junction, were obtained without and with intravenous contrast. CONTRAST:  8mL GADAVIST GADOBUTROL 1 MMOL/ML IV SOLN COMPARISON:  None. FINDINGS: Alignment: Cervical spine straightening. Trace anterolisthesis of C2 on C3. Vertebrae: No fracture, suspicious osseous lesion, or significant marrow edema. Cord: Normal signal and morphology. No abnormal enhancement. Posterior Fossa, vertebral arteries, paraspinal tissues: Unremarkable. Disc levels: Mild disc bulging from C3-4 to C6-7 without significant stenosis or significant spinal cord  mass effect. IMPRESSION: 1. Normal appearance of the cervical spinal cord. 2. Mild cervical spondylosis without stenosis. Electronically Signed   By: Sebastian Ache M.D.   On: 01/25/2019 19:41   Mr [place] UJ Contrast  Result Date: 01/24/2019 CLINICAL DATA:  Neuro deficit(s), headache, basilar or orbital headache, visual loss,  left afferent pupillary defect. Additional history provided: Patient reports headache on left side of head for 6 days and gradual loss of vision in left eye. History of CVA in 2001. EXAM: MRI HEAD AND ORBITS WITHOUT AND WITH CONTRAST TECHNIQUE: Multiplanar, multiecho pulse sequences of the brain and surrounding structures were obtained without and with intravenous contrast. Multiplanar, multiecho pulse sequences of the orbits and surrounding structures were obtained including fat saturation techniques, before and after intravenous contrast administration. CONTRAST:  7mL GADAVIST GADOBUTROL 1 MMOL/ML IV SOLN COMPARISON:  Head CT 01/24/2019 FINDINGS: MRI HEAD FINDINGS Brain: There is no convincing evidence of acute infarct. No evidence of intracranial mass. No midline shift or extra-axial fluid collection. No chronic intracranial blood products. No focal parenchymal signal abnormality. Cerebral volume is normal for age. No abnormal intracranial enhancement is identified. Vascular: Flow voids maintained within the proximal large arterial vessels. Skull and upper cervical spine: No focal marrow lesion MRI ORBITS FINDINGS Orbits: There is abnormal T2 hyperintensity and swelling of the intracanalicular and intraorbital left optic nerve. Associated abnormal enhancement of the intracanalicular and intraorbital left optic nerve sheath. Additionally, there is abnormal enhancement of the intraorbital left optic nerve distally. There is apparent increased DWI signal within the intraorbital left optic nerve on concurrent brain MRI, although no definite corresponding ADC hypointensity is identified. The globes are normal in size and contour. The extraocular muscles are grossly symmetric. The suprasellar cistern is patent. No mass effect upon the optic chiasm. Visualized sinuses: Mild scattered paranasal sinus mucosal thickening. Frothy secretions within right-sided ethmoid air cells. Small right sphenoid sinus air-fluid level.  Soft tissues: Negative Limited intracranial: Reported above under the MRI brain findings section. These results were called by telephone at the time of interpretation on 01/24/2019 at 6:55 pm to provider Shaune Pollack , who verbally acknowledged these results. IMPRESSION: MRI orbits: 1. T2 hyperintense signal abnormality and swelling of the intracanalicular and intraorbital left optic nerve. Associated abnormal enhancement of the intracanalicular and intraorbital left optic nerve sheath, as well as abnormal enhancement of the intraorbital left optic nerve distally. Findings are consistent with optic neuritis. 2. Paranasal sinus disease as described. This includes frothy secretions within right ethmoid air cells and a small right sphenoid sinus air-fluid level. Correlate for acute sinusitis. MRI head: Unremarkable MRI appearance of the brain for age. Electronically Signed   By: Jackey Loge   On: 01/24/2019 18:56    Scheduled Meds: . heparin  5,000 Units Subcutaneous Q8H  . insulin aspart  0-5 Units Subcutaneous QHS  . insulin aspart  0-9 Units Subcutaneous TID WC  . pantoprazole  40 mg Oral Daily   Continuous Infusions: . methylPREDNISolone (SOLU-MEDROL) injection Stopped (01/25/19 2210)    Assessment/Plan:  1. Optic neuritis of the left eye with vision loss and eye pain.  Appreciate neurology consultation and ophthalmology consultation.  High-dose Solu-Medrol for 5 days.  MRI of the brain and cervical spine did not show any other lesions suspicious for MS.  ANA pending.  2. GERD on Protonix 3. Elevated glucose likely secondary to steroids.  Check sliding scale and a hemoglobin A1c.  Code Status:     Code Status Orders  (From  admission, onward)         Start     Ordered   01/24/19 2217  Full code  Continuous     01/24/19 2217        Code Status History    This patient has a current code status but no historical code status.   Advance Care Planning Activity     Family  Communication: Spoke with brother at the bedside Disposition Plan: We will need 3 more evenings of steroids.  Consultants:  Neurology  Ophthalmology  Time spent: 28 minutes through translator  [employer]

## 2019-01-26 NOTE — Progress Notes (Signed)
Subjective: No improvement or changes from yesterday   Past Medical History:  Diagnosis Date  . GERD (gastroesophageal reflux disease)   . Gestational diabetes   . Stroke [facility])     Past Surgical History:  Procedure Laterality Date  . COLPOSCOPY  02/27/2016   Colpo bx results: Transformation zone tissue with no dysplasia or carcinoma identified    Family History  Problem Relation Age of Onset  . Diabetes Mother     Social History:  reports that she has never smoked. She has never used smokeless tobacco. She reports that she does not drink alcohol or use drugs.  No Known Allergies    Physical Examination: Blood pressure (!) 105/59, pulse 64, temperature 98.6 F (37 C), temperature source Oral, resp. rate 16, height  (1.626 m), weight 80 kg, last menstrual period 01/04/2019, SpO2 96 %.   Neurological Examination   Mental Status: Alert, oriented, thought content appropriate.  Speech fluent without evidence of aphasia.  Able to follow 3 step commands without difficulty. Cranial Nerves: II: Sees grayish images L eye but cant tell me how many fingers I am showing her on L eye.  III,IV, VI: ptosis not present, extra-ocular motions intact bilaterally V,VII: smile symmetric, facial light touch sensation normal bilaterally VIII: hearing normal bilaterally IX,X: gag reflex present XI: bilateral shoulder shrug XII: midline tongue extension Motor: Right : Upper extremity   5/5    Left:     Upper extremity   5/5  Lower extremity   5/5     Lower extremity   5/5 Tone and bulk:normal tone throughout; no atrophy noted Sensory: Pinprick and light touch intact throughout, bilaterally Deep Tendon Reflexes: 2+ and symmetric throughout Plantars: Right: downgoing   Left: downgoing Cerebellar: normal finger-to-nose, normal rapid alternating movements and normal heel-to-shin test Gait: not tested      Laboratory Studies:   Basic Metabolic Panel: Recent Labs  Lab 01/24/19 1513  01/25/19 0523  NA 137 135  K 3.9 4.0  CL 104 104  CO2 21* 19*  GLUCOSE 101* 163*  BUN 12 13  CREATININE 0.64 0.81  CALCIUM 8.9 8.9    Liver Function Tests: No results for input(s): AST, ALT, ALKPHOS, BILITOT, PROT, ALBUMIN in the last 168 hours. No results for input(s): LIPASE, AMYLASE in the last 168 hours. No results for input(s): AMMONIA in the last 168 hours.  CBC: Recent Labs  Lab 01/24/19 1513 01/25/19 0523  WBC 7.1 5.9  NEUTROABS 3.5  --   HGB 11.6* 12.0  HCT 36.7 37.4  MCV 80.1 79.4*  PLT 277 306    Cardiac Enzymes: No results for input(s): CKTOTAL, CKMB, CKMBINDEX, TROPONINI in the last 168 hours.  BNP: Invalid input(s): POCBNP  CBG: No results for input(s): GLUCAP in the last 168 hours.  Microbiology: Results for orders placed or performed during the hospital encounter of 01/24/19  SARS CORONAVIRUS 2 (TAT 6-24 HRS) Nasopharyngeal Nasopharyngeal Swab     Status: None   Collection Time: 01/24/19  9:52 PM   Specimen: Nasopharyngeal Swab  Result Value Ref Range Status   SARS Coronavirus 2 NEGATIVE NEGATIVE Final    Comment: (NOTE) SARS-CoV-2 target nucleic acids are NOT DETECTED. The SARS-CoV-2 RNA is generally detectable in upper and lower respiratory specimens during the acute phase of infection. Negative results do not preclude SARS-CoV-2 infection, do not rule out co-infections with other pathogens, and should not be used as the sole basis for treatment or other patient management decisions. Negative results must  be combined with clinical observations, patient history, and epidemiological information. The expected result is Negative. Fact Sheet for Patients: SugarRoll.be Fact Sheet for Healthcare Providers: https://www.woods-mathews.com/ This test is not yet approved or cleared by the [place] FDA and  has been authorized for detection and/or diagnosis of SARS-CoV-2 by FDA under an Emergency Use  Authorization (EUA). This EUA will remain  in effect (meaning this test can be used) for the duration of the COVID-19 declaration under Section 56 4(b)(1) of the Act, 21 U.S.C. section 360bbb-3(b)(1), unless the authorization is terminated or revoked sooner. Performed at [facility], [place] [address] [place], [place] [address]     Coagulation Studies: No results for input(s): LABPROT, INR in the last 72 hours.  Urinalysis:  Recent Labs  Lab 01/24/19 1513  COLORURINE YELLOW*  LABSPEC 1.010  PHURINE 7.0  GLUCOSEU NEGATIVE  HGBUR NEGATIVE  BILIRUBINUR NEGATIVE  KETONESUR NEGATIVE  PROTEINUR NEGATIVE  NITRITE NEGATIVE  LEUKOCYTESUR LARGE*    Lipid Panel:  No results found for: CHOL, TRIG, HDL, CHOLHDL, VLDL, LDLCALC  HgbA1C: No results found for: HGBA1C  Urine Drug Screen:  No results found for: LABOPIA, COCAINSCRNUR, LABBENZ, AMPHETMU, THCU, LABBARB  Alcohol Level: No results for input(s): ETH in the last 168 hours.  Other results: EKG: normal EKG, normal sinus rhythm, unchanged from previous tracings.  Imaging: Ct Head Wo Contrast  Result Date: 01/24/2019 CLINICAL DATA:  Left side headache for 6 days with gradual onset of vision loss in the left eye. EXAM: CT HEAD WITHOUT CONTRAST TECHNIQUE: Contiguous axial images were obtained from the base of the skull through the vertex without intravenous contrast. COMPARISON:  None. FINDINGS: Brain: No evidence of acute infarction, hemorrhage, hydrocephalus, extra-axial collection or mass lesion/mass effect. Vascular: No hyperdense vessel or unexpected calcification. Skull: Normal. Negative for fracture or focal lesion. Sinuses/Orbits: Negative. Other: None. IMPRESSION: Normal head CT. Electronically Signed   By: Inge Rise M.D.   On: 01/24/2019 15:48   Mr Brain W And Wo Contrast  Result Date: 01/24/2019 CLINICAL DATA:  Neuro deficit(s), headache, basilar or orbital headache, visual loss, left afferent pupillary defect.  Additional history provided: Patient reports headache on left side of head for 6 days and gradual loss of vision in left eye. History of CVA in 2001. EXAM: MRI HEAD AND ORBITS WITHOUT AND WITH CONTRAST TECHNIQUE: Multiplanar, multiecho pulse sequences of the brain and surrounding structures were obtained without and with intravenous contrast. Multiplanar, multiecho pulse sequences of the orbits and surrounding structures were obtained including fat saturation techniques, before and after intravenous contrast administration. CONTRAST:  92mL GADAVIST GADOBUTROL 1 MMOL/ML IV SOLN COMPARISON:  Head CT 01/24/2019 FINDINGS: MRI HEAD FINDINGS Brain: There is no convincing evidence of acute infarct. No evidence of intracranial mass. No midline shift or extra-axial fluid collection. No chronic intracranial blood products. No focal parenchymal signal abnormality. Cerebral volume is normal for age. No abnormal intracranial enhancement is identified. Vascular: Flow voids maintained within the proximal large arterial vessels. Skull and upper cervical spine: No focal marrow lesion MRI ORBITS FINDINGS Orbits: There is abnormal T2 hyperintensity and swelling of the intracanalicular and intraorbital left optic nerve. Associated abnormal enhancement of the intracanalicular and intraorbital left optic nerve sheath. Additionally, there is abnormal enhancement of the intraorbital left optic nerve distally. There is apparent increased DWI signal within the intraorbital left optic nerve on concurrent brain MRI, although no definite corresponding ADC hypointensity is identified. The globes are normal in size and contour. The extraocular  muscles are grossly symmetric. The suprasellar cistern is patent. No mass effect upon the optic chiasm. Visualized sinuses: Mild scattered paranasal sinus mucosal thickening. Frothy secretions within right-sided ethmoid air cells. Small right sphenoid sinus air-fluid level. Soft tissues: Negative Limited  intracranial: Reported above under the MRI brain findings section. These results were called by telephone at the time of interpretation on 01/24/2019 at 6:55 pm to provider Shaune PollackAMERON ISAACS , who verbally acknowledged these results. IMPRESSION: MRI orbits: 1. T2 hyperintense signal abnormality and swelling of the intracanalicular and intraorbital left optic nerve. Associated abnormal enhancement of the intracanalicular and intraorbital left optic nerve sheath, as well as abnormal enhancement of the intraorbital left optic nerve distally. Findings are consistent with optic neuritis. 2. Paranasal sinus disease as described. This includes frothy secretions within right ethmoid air cells and a small right sphenoid sinus air-fluid level. Correlate for acute sinusitis. MRI head: Unremarkable MRI appearance of the brain for age. Electronically Signed   By: Jackey LogeKyle  Golden   On: 01/24/2019 18:56   Mr Cervical Spine W Wo Contrast  Result Date: 01/25/2019 CLINICAL DATA:  Optic neuritis. EXAM: MRI CERVICAL SPINE WITHOUT AND WITH CONTRAST TECHNIQUE: Multiplanar and multiecho pulse sequences of the cervical spine, to include the craniocervical junction and cervicothoracic junction, were obtained without and with intravenous contrast. CONTRAST:  8mL GADAVIST GADOBUTROL 1 MMOL/ML IV SOLN COMPARISON:  None. FINDINGS: Alignment: Cervical spine straightening. Trace anterolisthesis of C2 on C3. Vertebrae: No fracture, suspicious osseous lesion, or significant marrow edema. Cord: Normal signal and morphology. No abnormal enhancement. Posterior Fossa, vertebral arteries, paraspinal tissues: Unremarkable. Disc levels: Mild disc bulging from C3-4 to C6-7 without significant stenosis or significant spinal cord mass effect. IMPRESSION: 1. Normal appearance of the cervical spinal cord. 2. Mild cervical spondylosis without stenosis. Electronically Signed   By: Sebastian AcheAllen  Grady M.D.   On: 01/25/2019 19:41   Mr Rockwell GermanyOrbits W GNWo Contrast  Result Date:  01/24/2019 CLINICAL DATA:  Neuro deficit(s), headache, basilar or orbital headache, visual loss, left afferent pupillary defect. Additional history provided: Patient reports headache on left side of head for 6 days and gradual loss of vision in left eye. History of CVA in 2001. EXAM: MRI HEAD AND ORBITS WITHOUT AND WITH CONTRAST TECHNIQUE: Multiplanar, multiecho pulse sequences of the brain and surrounding structures were obtained without and with intravenous contrast. Multiplanar, multiecho pulse sequences of the orbits and surrounding structures were obtained including fat saturation techniques, before and after intravenous contrast administration. CONTRAST:  7mL GADAVIST GADOBUTROL 1 MMOL/ML IV SOLN COMPARISON:  Head CT 01/24/2019 FINDINGS: MRI HEAD FINDINGS Brain: There is no convincing evidence of acute infarct. No evidence of intracranial mass. No midline shift or extra-axial fluid collection. No chronic intracranial blood products. No focal parenchymal signal abnormality. Cerebral volume is normal for age. No abnormal intracranial enhancement is identified. Vascular: Flow voids maintained within the proximal large arterial vessels. Skull and upper cervical spine: No focal marrow lesion MRI ORBITS FINDINGS Orbits: There is abnormal T2 hyperintensity and swelling of the intracanalicular and intraorbital left optic nerve. Associated abnormal enhancement of the intracanalicular and intraorbital left optic nerve sheath. Additionally, there is abnormal enhancement of the intraorbital left optic nerve distally. There is apparent increased DWI signal within the intraorbital left optic nerve on concurrent brain MRI, although no definite corresponding ADC hypointensity is identified. The globes are normal in size and contour. The extraocular muscles are grossly symmetric. The suprasellar cistern is patent. No mass effect upon the optic chiasm. Visualized sinuses: Mild  scattered paranasal sinus mucosal thickening.  Frothy secretions within right-sided ethmoid air cells. Small right sphenoid sinus air-fluid level. Soft tissues: Negative Limited intracranial: Reported above under the MRI brain findings section. These results were called by telephone at the time of interpretation on 01/24/2019 at 6:55 pm to provider Shaune Pollack , who verbally acknowledged these results. IMPRESSION: MRI orbits: 1. T2 hyperintense signal abnormality and swelling of the intracanalicular and intraorbital left optic nerve. Associated abnormal enhancement of the intracanalicular and intraorbital left optic nerve sheath, as well as abnormal enhancement of the intraorbital left optic nerve distally. Findings are consistent with optic neuritis. 2. Paranasal sinus disease as described. This includes frothy secretions within right ethmoid air cells and a small right sphenoid sinus air-fluid level. Correlate for acute sinusitis. MRI head: Unremarkable MRI appearance of the brain for age. Electronically Signed   By: Jackey Loge   On: 01/24/2019 18:56     Assessment/Plan:  43 y.o. female with a known history of gastroesophageal reflux disease, gestational diabetes, stroke-started having headache and loss of vision from left eye for last 6 days that is painful.  This is persistence and not going away so decided to come to emergency room.  On further questioning she says that she sees some bright lights and some grayish color to her left eye but cannot see clear. She denies any associated focal neurological symptoms like weakness, tingling, numbness.  MRI orbits consistent with L optic neuritis.    - MRI brain with and without contrast no acute abnormalities. No chronic lesions that would point to history of multiple sclerosis - MRI orbits L optic nerve swelling consistent with optic neuritis - Agree with solumedrol 1g daily for 5 days total  - glucose surveillance - MRI C spine no active lesions - will likely have to be on prednisone post  steroids treatment as out pt  01/26/2019, 12:13 PM

## 2019-01-27 LAB — BASIC METABOLIC PANEL
Anion gap: 11 (ref 5–15)
BUN: 17 mg/dL (ref 6–20)
CO2: 24 mmol/L (ref 22–32)
Calcium: 8.8 mg/dL — ABNORMAL LOW (ref 8.9–10.3)
Chloride: 101 mmol/L (ref 98–111)
Creatinine, Ser: 0.71 mg/dL (ref 0.44–1.00)
GFR calc Af Amer: >60 mL/min (ref >60)
GFR calc non Af Amer: >60 mL/min (ref >60)
Glucose, Bld: 197 mg/dL — ABNORMAL HIGH (ref 70–99)
Potassium: 3.9 mmol/L (ref 3.5–5.1)
Sodium: 136 mmol/L (ref 135–145)

## 2019-01-27 LAB — GLUCOSE, CAPILLARY
Glucose-Capillary: 133 mg/dL — ABNORMAL HIGH (ref 70–99)
Glucose-Capillary: 259 mg/dL — ABNORMAL HIGH (ref 70–99)
Glucose-Capillary: 155 mg/dL — ABNORMAL HIGH (ref 70–99)
Glucose-Capillary: 124 mg/dL — ABNORMAL HIGH (ref 70–99)

## 2019-01-27 MED ORDER — ENOXAPARIN SODIUM 40 MG/0.4ML ~~LOC~~ SOLN
40.0000 mg | SUBCUTANEOUS | Status: DC
  Administered 2019-01-27 – 2019-01-28 (×2): 40 mg via SUBCUTANEOUS
  Filled 2019-01-27 (×2): qty 0.4

## 2019-01-27 NOTE — Progress Notes (Signed)
Much improved. Patient is now able to count and see fingers on L side which she could only see grey scale before.   Past Medical History:  Diagnosis Date  . GERD (gastroesophageal reflux disease)   . Gestational diabetes   . Stroke [facility])     Past Surgical History:  Procedure Laterality Date  . COLPOSCOPY  02/27/2016   Colpo bx results: Transformation zone tissue with no dysplasia or carcinoma identified    Family History  Problem Relation Age of Onset  . Diabetes Mother     Social History:  reports that she has never smoked. She has never used smokeless tobacco. She reports that she does not drink alcohol or use drugs.  No Known Allergies    Physical Examination: Blood pressure 121/75, pulse 67, temperature 98.4 F (36.9 C), temperature source Oral, resp. rate 16, height 5\' 4"  (1.626 m), weight 80 kg, last menstrual period 01/04/2019, SpO2 97 %.   Neurological Examination   Mental Status: Alert, oriented, thought content appropriate.  Speech fluent without evidence of aphasia.  Able to follow 3 step commands without difficulty. Cranial Nerves: II: Sees grayish images L eye but cant tell me how many fingers I am showing her on L eye.  III,IV, VI: ptosis not present, extra-ocular motions intact bilaterally V,VII: smile symmetric, facial light touch sensation normal bilaterally VIII: hearing normal bilaterally IX,X: gag reflex present XI: bilateral shoulder shrug XII: midline tongue extension Motor: Right : Upper extremity   5/5    Left:     Upper extremity   5/5  Lower extremity   5/5     Lower extremity   5/5 Tone and bulk:normal tone throughout; no atrophy noted Sensory: Pinprick and light touch intact throughout, bilaterally Deep Tendon Reflexes: 2+ and symmetric throughout Plantars: Right: downgoing   Left: downgoing Cerebellar: normal finger-to-nose, normal rapid alternating movements and normal heel-to-shin test Gait: not tested      Laboratory Studies:    Basic Metabolic Panel: Recent Labs  Lab 01/24/19 1513 01/25/19 0523 01/27/19 0424  NA 137 135 136  K 3.9 4.0 3.9  CL 104 104 101  CO2 21* 19* 24  GLUCOSE 101* 163* 197*  BUN 12 13 17   CREATININE 0.64 0.81 0.71  CALCIUM 8.9 8.9 8.8*    Liver Function Tests: No results for input(s): AST, ALT, ALKPHOS, BILITOT, PROT, ALBUMIN in the last 168 hours. No results for input(s): LIPASE, AMYLASE in the last 168 hours. No results for input(s): AMMONIA in the last 168 hours.  CBC: Recent Labs  Lab 01/24/19 1513 01/25/19 0523  WBC 7.1 5.9  NEUTROABS 3.5  --   HGB 11.6* 12.0  HCT 36.7 37.4  MCV 80.1 79.4*  PLT 277 306    Cardiac Enzymes: No results for input(s): CKTOTAL, CKMB, CKMBINDEX, TROPONINI in the last 168 hours.  BNP: Invalid input(s): POCBNP  CBG: Recent Labs  Lab 01/26/19 1659 01/26/19 2213 01/27/19 0814  GLUCAP 141* 138* 133*    Microbiology: Results for orders placed or performed during the hospital encounter of 01/24/19  SARS CORONAVIRUS 2 (TAT 6-24 HRS) Nasopharyngeal Nasopharyngeal Swab     Status: None   Collection Time: 01/24/19  9:52 PM   Specimen: Nasopharyngeal Swab  Result Value Ref Range Status   SARS Coronavirus 2 NEGATIVE NEGATIVE Final    Comment: (NOTE) SARS-CoV-2 target nucleic acids are NOT DETECTED. The SARS-CoV-2 RNA is generally detectable in upper and lower respiratory specimens during the acute phase of infection. Negative results do  not preclude SARS-CoV-2 infection, do not rule out co-infections with other pathogens, and should not be used as the sole basis for treatment or other patient management decisions. Negative results must be combined with clinical observations, patient history, and epidemiological information. The expected result is Negative. Fact Sheet for Patients: SugarRoll.be Fact Sheet for Healthcare Providers: https://www.woods-mathews.com/ This test is not yet approved  or cleared by the [place] FDA and  has been authorized for detection and/or diagnosis of SARS-CoV-2 by FDA under an Emergency Use Authorization (EUA). This EUA will remain  in effect (meaning this test can be used) for the duration of the COVID-19 declaration under Section 56 4(b)(1) of the Act, 21 U.S.C. section 360bbb-3(b)(1), unless the authorization is terminated or revoked sooner. Performed at [facility], [place] [address] [place], [place] [address]     Coagulation Studies: No results for input(s): LABPROT, INR in the last 72 hours.  Urinalysis:  Recent Labs  Lab 01/24/19 1513  COLORURINE YELLOW*  LABSPEC 1.010  PHURINE 7.0  GLUCOSEU NEGATIVE  HGBUR NEGATIVE  BILIRUBINUR NEGATIVE  KETONESUR NEGATIVE  PROTEINUR NEGATIVE  NITRITE NEGATIVE  LEUKOCYTESUR LARGE*    Lipid Panel:  No results found for: CHOL, TRIG, HDL, CHOLHDL, VLDL, LDLCALC  HgbA1C:  Lab Results  Component Value Date   HGBA1C 5.8 (H) 01/25/2019    Urine Drug Screen:  No results found for: LABOPIA, COCAINSCRNUR, LABBENZ, AMPHETMU, THCU, LABBARB  Alcohol Level: No results for input(s): ETH in the last 168 hours.  Other results: EKG: normal EKG, normal sinus rhythm, unchanged from previous tracings.  Imaging: Mr Cervical Spine W Wo Contrast  Result Date: 01/25/2019 CLINICAL DATA:  Optic neuritis. EXAM: MRI CERVICAL SPINE WITHOUT AND WITH CONTRAST TECHNIQUE: Multiplanar and multiecho pulse sequences of the cervical spine, to include the craniocervical junction and cervicothoracic junction, were obtained without and with intravenous contrast. CONTRAST:  65mL GADAVIST GADOBUTROL 1 MMOL/ML IV SOLN COMPARISON:  None. FINDINGS: Alignment: Cervical spine straightening. Trace anterolisthesis of C2 on C3. Vertebrae: No fracture, suspicious osseous lesion, or significant marrow edema. Cord: Normal signal and morphology. No abnormal enhancement. Posterior Fossa, vertebral arteries, paraspinal tissues:  Unremarkable. Disc levels: Mild disc bulging from C3-4 to C6-7 without significant stenosis or significant spinal cord mass effect. IMPRESSION: 1. Normal appearance of the cervical spinal cord. 2. Mild cervical spondylosis without stenosis. Electronically Signed   By: Logan Bores M.D.   On: 01/25/2019 19:41     Assessment/Plan:  43 y.o. female with a known history of gastroesophageal reflux disease, gestational diabetes, stroke-started having headache and loss of vision from left eye for last 6 days that is painful.    - Vision much improved - Finish solumedrol for 5 total doses - Prednisone 7 days taper after d/c - Neurology follow up as out patient  01/27/2019, 10:56 AM

## 2019-01-27 NOTE — Progress Notes (Signed)
Patient ID: [name], female   DOB: 09/15/1975, 43 y.o.   MRN: [id number]  [facility] PROGRESS NOTE  Chelsea Higgins CXK:[id number] DOB: [birth year] DOA: 01/24/2019 PCP: Patient, No Pcp Per  HPI/Subjective: Patient with left eye pain and vision loss.  Today able to see my fingers with her left eye and also able to read a few large numbers on the vision card.  Objective: Vitals:   01/26/19 1927 01/27/19 0349  BP: 106/65 121/75  Pulse: 65 67  Resp: 16 16  Temp: 98.1 F (36.7 C) 98.4 F (36.9 C)  SpO2: 98% 97%    Intake/Output Summary (Last 24 hours) at 01/27/2019 1529 Last data filed at 01/27/2019 1300 Gross per 24 hour  Intake 1090 ml  Output -  Net 1090 ml   Filed Weights   01/24/19 1429 01/24/19 2218  Weight: 77.1 kg 80 kg    ROS: Review of Systems  Constitutional: Negative for chills and fever.  Eyes: Positive for blurred vision and pain.  Respiratory: Negative for cough and shortness of breath.   Cardiovascular: Negative for chest pain.  Gastrointestinal: Negative for abdominal pain, constipation, diarrhea, nausea and vomiting.  Genitourinary: Negative for dysuria.  Musculoskeletal: Negative for joint pain.  Neurological: Negative for dizziness and headaches.   Exam: Physical Exam  Constitutional: She is oriented to person, place, and time.  HENT:  Nose: No mucosal edema.  Mouth/Throat: No oropharyngeal exudate or posterior oropharyngeal edema.  Eyes: Pupils are equal, round, and reactive to light. Conjunctivae, EOM and lids are normal.  Patient able to see my fingers with her left eye and her right eye covered.  Today also able to see a few numbers on the vision card.  Neck: No JVD present. Carotid bruit is not present. No edema present. No thyroid mass and no thyromegaly present.  Cardiovascular: S1 normal and S2 normal. Exam reveals no gallop.  No murmur heard. Pulses:      Dorsalis pedis pulses are 2+ on the right side and  2+ on the left side.  Respiratory: No respiratory distress. She has no wheezes. She has no rhonchi. She has no rales.  GI: Soft. Bowel sounds are normal. There is no abdominal tenderness.  Musculoskeletal:     Right ankle: She exhibits no swelling.     Left ankle: She exhibits no swelling.  Lymphadenopathy:    She has no cervical adenopathy.  Neurological: She is alert and oriented to person, place, and time. No cranial nerve deficit.  Skin: Skin is warm. No rash noted. Nails show no clubbing.  Psychiatric: She has a normal mood and affect.      Data Reviewed: Basic Metabolic Panel: Recent Labs  Lab 01/24/19 1513 01/25/19 0523 01/27/19 0424  NA 137 135 136  K 3.9 4.0 3.9  CL 104 104 101  CO2 21* 19* 24  GLUCOSE 101* 163* 197*  BUN 12 13 17   CREATININE 0.64 0.81 0.71  CALCIUM 8.9 8.9 8.8*    CBC: Recent Labs  Lab 01/24/19 1513 01/25/19 0523  WBC 7.1 5.9  NEUTROABS 3.5  --   HGB 11.6* 12.0  HCT 36.7 37.4  MCV 80.1 79.4*  PLT 277 306     Studies: Mr Cervical Spine W Wo Contrast  Result Date: 01/25/2019 CLINICAL DATA:  Optic neuritis. EXAM: MRI CERVICAL SPINE WITHOUT AND WITH CONTRAST TECHNIQUE: Multiplanar and multiecho pulse sequences of the cervical spine, to include the craniocervical junction and cervicothoracic junction, were obtained without and with intravenous  contrast. CONTRAST:  37mL GADAVIST GADOBUTROL 1 MMOL/ML IV SOLN COMPARISON:  None. FINDINGS: Alignment: Cervical spine straightening. Trace anterolisthesis of C2 on C3. Vertebrae: No fracture, suspicious osseous lesion, or significant marrow edema. Cord: Normal signal and morphology. No abnormal enhancement. Posterior Fossa, vertebral arteries, paraspinal tissues: Unremarkable. Disc levels: Mild disc bulging from C3-4 to C6-7 without significant stenosis or significant spinal cord mass effect. IMPRESSION: 1. Normal appearance of the cervical spinal cord. 2. Mild cervical spondylosis without stenosis.  Electronically Signed   By: Logan Bores M.D.   On: 01/25/2019 19:41    Scheduled Meds: . enoxaparin (LOVENOX) injection  40 mg Subcutaneous Q24H  . insulin aspart  0-5 Units Subcutaneous QHS  . insulin aspart  0-9 Units Subcutaneous TID WC  . pantoprazole  40 mg Oral Daily   Continuous Infusions: . methylPREDNISolone (SOLU-MEDROL) injection Stopped (01/27/19 3235)    Assessment/Plan:  1. Optic neuritis of the left eye with vision loss and eye pain.  Appreciate neurology consultation and ophthalmology consultation.  High-dose Solu-Medrol for 5 days.  Patient to receive the fourth dose tonight. MRI of the brain and cervical spine did not show any other lesions suspicious for MS.  ANA negative.  Neurology recommends 7 more days of prednisone taper upon discharge. 2. GERD on Protonix 3. Elevated glucose likely secondary to steroids.  Hemoglobin A1c 5.8.  Patient not a diabetic but steroids raising the sugars.  Code Status:     Code Status Orders  (From admission, onward)         Start     Ordered   01/24/19 2217  Full code  Continuous     01/24/19 2217        Code Status History    This patient has a current code status but no historical code status.   Advance Care Planning Activity     Family Communication: Spoke with brother on the bedside yesterday Disposition Plan: We will need 2 more evenings of steroids.  Likely discharge Saturday morning.  Consultants:  Neurology  Ophthalmology  Time spent: 27 minutes through [place]

## 2019-01-28 LAB — GLUCOSE, CAPILLARY
Glucose-Capillary: 144 mg/dL — ABNORMAL HIGH (ref 70–99)
Glucose-Capillary: 219 mg/dL — ABNORMAL HIGH (ref 70–99)
Glucose-Capillary: 127 mg/dL — ABNORMAL HIGH (ref 70–99)
Glucose-Capillary: 139 mg/dL — ABNORMAL HIGH (ref 70–99)

## 2019-01-28 MED ORDER — SODIUM CHLORIDE 0.9 % IV SOLN
INTRAVENOUS | Status: DC | PRN
  Administered 2019-01-28: 250 mL via INTRAVENOUS

## 2019-01-28 NOTE — Progress Notes (Signed)
Much improved. Patient is now able to count and see fingers on L side which she could only see grey scale before. Discussed with son at bedside today  Past Medical History:  Diagnosis Date  . GERD (gastroesophageal reflux disease)   . Gestational diabetes   . Stroke [facility])     Past Surgical History:  Procedure Laterality Date  . COLPOSCOPY  02/27/2016   Colpo bx results: Transformation zone tissue with no dysplasia or carcinoma identified    Family History  Problem Relation Age of Onset  . Diabetes Mother     Social History:  reports that she has never smoked. She has never used smokeless tobacco. She reports that she does not drink alcohol or use drugs.  No Known Allergies    Physical Examination: Blood pressure (!) 99/55, pulse 65, temperature 98.8 F (37.1 C), temperature source Oral, resp. rate 16, height 5\' 4"  (1.626 m), weight 80 kg, last menstrual period 01/04/2019, SpO2 97 %.   Neurological Examination   Mental Status: Alert, oriented, thought content appropriate.  Speech fluent without evidence of aphasia.  Able to follow 3 step commands without difficulty. Cranial Nerves: II: Sees grayish images L eye but cant tell me how many fingers I am showing her on L eye.  III,IV, VI: ptosis not present, extra-ocular motions intact bilaterally V,VII: smile symmetric, facial light touch sensation normal bilaterally VIII: hearing normal bilaterally IX,X: gag reflex present XI: bilateral shoulder shrug XII: midline tongue extension Motor: Right : Upper extremity   5/5    Left:     Upper extremity   5/5  Lower extremity   5/5     Lower extremity   5/5 Tone and bulk:normal tone throughout; no atrophy noted Sensory: Pinprick and light touch intact throughout, bilaterally Deep Tendon Reflexes: 2+ and symmetric throughout Plantars: Right: downgoing   Left: downgoing Cerebellar: normal finger-to-nose, normal rapid alternating movements and normal heel-to-shin test Gait: not  tested      Laboratory Studies:   Basic Metabolic Panel: Recent Labs  Lab 01/24/19 1513 01/25/19 0523 01/27/19 0424  NA 137 135 136  K 3.9 4.0 3.9  CL 104 104 101  CO2 21* 19* 24  GLUCOSE 101* 163* 197*  BUN 12 13 17   CREATININE 0.64 0.81 0.71  CALCIUM 8.9 8.9 8.8*    Liver Function Tests: No results for input(s): AST, ALT, ALKPHOS, BILITOT, PROT, ALBUMIN in the last 168 hours. No results for input(s): LIPASE, AMYLASE in the last 168 hours. No results for input(s): AMMONIA in the last 168 hours.  CBC: Recent Labs  Lab 01/24/19 1513 01/25/19 0523  WBC 7.1 5.9  NEUTROABS 3.5  --   HGB 11.6* 12.0  HCT 36.7 37.4  MCV 80.1 79.4*  PLT 277 306    Cardiac Enzymes: No results for input(s): CKTOTAL, CKMB, CKMBINDEX, TROPONINI in the last 168 hours.  BNP: Invalid input(s): POCBNP  CBG: Recent Labs  Lab 01/27/19 1133 01/27/19 1645 01/27/19 2119 01/28/19 0729 01/28/19 1207  GLUCAP 259* 155* 124* 144* 219*    Microbiology: Results for orders placed or performed during the hospital encounter of 01/24/19  SARS CORONAVIRUS 2 (TAT 6-24 HRS) Nasopharyngeal Nasopharyngeal Swab     Status: None   Collection Time: 01/24/19  9:52 PM   Specimen: Nasopharyngeal Swab  Result Value Ref Range Status   SARS Coronavirus 2 NEGATIVE NEGATIVE Final    Comment: (NOTE) SARS-CoV-2 target nucleic acids are NOT DETECTED. The SARS-CoV-2 RNA is generally detectable in upper and  lower respiratory specimens during the acute phase of infection. Negative results do not preclude SARS-CoV-2 infection, do not rule out co-infections with other pathogens, and should not be used as the sole basis for treatment or other patient management decisions. Negative results must be combined with clinical observations, patient history, and epidemiological information. The expected result is Negative. Fact Sheet for Patients: SugarRoll.be Fact Sheet for Healthcare  Providers: https://www.woods-mathews.com/ This test is not yet approved or cleared by the [place] FDA and  has been authorized for detection and/or diagnosis of SARS-CoV-2 by FDA under an Emergency Use Authorization (EUA). This EUA will remain  in effect (meaning this test can be used) for the duration of the COVID-19 declaration under Section 56 4(b)(1) of the Act, 21 U.S.C. section 360bbb-3(b)(1), unless the authorization is terminated or revoked sooner. Performed at [facility], [place] [address], [place], [place] [address]     Coagulation Studies: No results for input(s): LABPROT, INR in the last 72 hours.  Urinalysis:  Recent Labs  Lab 01/24/19 1513  COLORURINE YELLOW*  LABSPEC 1.010  PHURINE 7.0  GLUCOSEU NEGATIVE  HGBUR NEGATIVE  BILIRUBINUR NEGATIVE  KETONESUR NEGATIVE  PROTEINUR NEGATIVE  NITRITE NEGATIVE  LEUKOCYTESUR LARGE*    Lipid Panel:  No results found for: CHOL, TRIG, HDL, CHOLHDL, VLDL, LDLCALC  HgbA1C:  Lab Results  Component Value Date   HGBA1C 5.8 (H) 01/25/2019    Urine Drug Screen:  No results found for: LABOPIA, COCAINSCRNUR, LABBENZ, AMPHETMU, THCU, LABBARB  Alcohol Level: No results for input(s): ETH in the last 168 hours.  Other results: EKG: normal EKG, normal sinus rhythm, unchanged from previous tracings.  Imaging: No results found.   Assessment/Plan:  43 y.o. female with a known history of gastroesophageal reflux disease, gestational diabetes, stroke-started having headache and loss of vision from left eye for last 6 days that is painful.    - Vision much improved - Finish solumedrol for 5 total doses should be done either tonight or tomorrow - Prednisone 7 days taper after d/c - Neurology follow up as out patient  01/28/2019, 12:44 PM

## 2019-01-28 NOTE — TOC Initial Note (Addendum)
Transition of Care [facility]) - Initial/Assessment Note    Patient Details  Name: Chelsea Higgins MRN: [id number] Date of Birth: [birth year]  [facility] CM/SW Contact:    Lanard Arguijo, Lenice Llamas Phone Number: [phone]  01/28/2019, 2:13 PM  Clinical Narrative: Clinical Social Worker ([place]) utilized a Administrator, sports and met with patient alone at bedside to discuss D/C plan. Patient was alert and oriented X4 and was sitting up in the bed. CSW introduced self and explained role of CSW department. Per patient she lives in [place] with her 2 children ages 67 and 79. Patient reported that she did have a job however she got sick with covid and is in the hospital now and has been out of work for several months. Patient reported that she has no income right now. Patient reported that she relies on her son and friends for transportation. Patient reported that she does not have a PCP. CSW provided patient spanish application for medication management and [facility]. CSW emailed Copy at [employer] and made her aware of referral with patient's verbal permission. CSW also provided patient with spanish booklet for free and low cost health care in [facility]. Patient reported no other needs or concerns. CSW will continue to follow and assist as needed.                    Expected Discharge Plan: Home/Self Care Barriers to Discharge: Continued Medical Work up   Patient Goals and CMS Choice Patient states their goals for this hospitalization and ongoing recovery are:: To go home.   Choice offered to / list presented to : NA  Expected Discharge Plan and Services Expected Discharge Plan: Home/Self Care In-house Referral: Clinical Social Work Discharge Planning Services: Medication Assistance, [facility]   Living arrangements for the past 2 months: Single Family Home Expected Discharge Date: 01/26/19               DME Arranged: N/A         HH  Arranged: NA          Prior Living Arrangements/Services Living arrangements for the past 2 months: Single Family Home Lives with:: Adult Children, Minor Children Patient language and need for interpreter reviewed:: Yes(Spanish) Do you feel safe going back to the place where you live?: Yes      Need for Family Participation in Patient Care: No (Comment) Care giver support system in place?: No (comment)   Criminal Activity/Legal Involvement Pertinent to Current Situation/Hospitalization: No - Comment as needed  Activities of Daily Living Home Assistive Devices/Equipment: None ADL Screening (condition at time of admission) Patient's cognitive ability adequate to safely complete daily activities?: Yes Is the patient deaf or have difficulty hearing?: No Does the patient have difficulty seeing, even when wearing glasses/contacts?: Yes(left eye) Does the patient have difficulty concentrating, remembering, or making decisions?: No Patient able to express need for assistance with ADLs?: Yes Does the patient have difficulty dressing or bathing?: No Independently performs ADLs?: Yes (appropriate for developmental age) Does the patient have difficulty walking or climbing stairs?: No Weakness of Legs: None Weakness of Arms/Hands: None  Permission Sought/Granted Permission sought to share information with : Other (comment)([facility]) Permission granted to share information with : Yes, Verbal Permission Granted              Emotional Assessment Appearance:: Appears stated age Attitude/Demeanor/Rapport: Engaged Affect (typically observed): Pleasant, Calm Orientation: : Oriented to Self, Oriented  to Place, Oriented to  Time, Oriented to Situation Alcohol / Substance Use: Not Applicable Psych Involvement: No (comment)  Admission diagnosis:  Optic neuritis [H46.9] Patient Active Problem List   Diagnosis Date Noted  . Optic neuritis 01/24/2019  . History of abnormal cervical  Papanicolaou smear 08/10/2018   PCP:  Patient, No Pcp Per Pharmacy:  No Pharmacies Listed    Social Determinants of Health (SDOH) Interventions    Readmission Risk Interventions No flowsheet data found.

## 2019-01-28 NOTE — Progress Notes (Signed)
Patient ID: Chelsea Higgins, female   DOB: [birth year], 44 y.o.   MRN: [id number]  [facility] PROGRESS NOTE  Chelsea Higgins [id number] DOB: 07-14-1975 DOA: 01/24/2019 PCP: Patient, No Pcp Per  HPI/Subjective: Patient feeling a little bit better.  Has some constipation.  Still has difficulty with vision in the left eye but able to see a little bit better.  Headache a little bit better.  Objective: Vitals:   01/28/19 0502 01/28/19 1521  BP: (!) 99/55 (!) 101/51  Pulse: 65 65  Resp: 16 19  Temp: 98.8 F (37.1 C) 99.1 F (37.3 C)  SpO2: 97% 98%    Intake/Output Summary (Last 24 hours) at 01/28/2019 1554 Last data filed at 01/28/2019 1026 Gross per 24 hour  Intake 240 ml  Output -  Net 240 ml   Filed Weights   01/24/19 1429 01/24/19 2218  Weight: 77.1 kg 80 kg    ROS: Review of Systems  Constitutional: Negative for chills and fever.  Eyes: Positive for blurred vision and pain.  Respiratory: Negative for cough and shortness of breath.   Cardiovascular: Negative for chest pain.  Gastrointestinal: Positive for constipation. Negative for abdominal pain, diarrhea, nausea and vomiting.  Genitourinary: Negative for dysuria.  Musculoskeletal: Negative for joint pain.  Neurological: Positive for headaches. Negative for dizziness.   Exam: Physical Exam  Constitutional: She is oriented to person, place, and time.  HENT:  Nose: No mucosal edema.  Mouth/Throat: No oropharyngeal exudate or posterior oropharyngeal edema.  Eyes: Pupils are equal, round, and reactive to light. Conjunctivae, EOM and lids are normal.  Patient able to see my fingers with her left eye and her right eye covered.  Today able to read the numbers on 20/400 on the vision card.  Neck: No JVD present. Carotid bruit is not present. No edema present. No thyroid mass and no thyromegaly present.  Cardiovascular: S1 normal and S2 normal. Exam reveals no gallop.  No murmur  heard. Pulses:      Dorsalis pedis pulses are 2+ on the right side and 2+ on the left side.  Respiratory: No respiratory distress. She has no wheezes. She has no rhonchi. She has no rales.  GI: Soft. Bowel sounds are normal. There is no abdominal tenderness.  Musculoskeletal:     Right ankle: She exhibits no swelling.     Left ankle: She exhibits no swelling.  Lymphadenopathy:    She has no cervical adenopathy.  Neurological: She is alert and oriented to person, place, and time. No cranial nerve deficit.  Skin: Skin is warm. No rash noted. Nails show no clubbing.  Psychiatric: She has a normal mood and affect.      Data Reviewed: Basic Metabolic Panel: Recent Labs  Lab 01/24/19 1513 01/25/19 0523 01/27/19 0424  NA 137 135 136  K 3.9 4.0 3.9  CL 104 104 101  CO2 21* 19* 24  GLUCOSE 101* 163* 197*  BUN 12 13 17   CREATININE 0.64 0.81 0.71  CALCIUM 8.9 8.9 8.8*    CBC: Recent Labs  Lab 01/24/19 1513 01/25/19 0523  WBC 7.1 5.9  NEUTROABS 3.5  --   HGB 11.6* 12.0  HCT 36.7 37.4  MCV 80.1 79.4*  PLT 277 306      Scheduled Meds: . enoxaparin (LOVENOX) injection  40 mg Subcutaneous Q24H  . insulin aspart  0-5 Units Subcutaneous QHS  . insulin aspart  0-9 Units Subcutaneous TID WC  . pantoprazole  40 mg Oral Daily  Continuous Infusions: . methylPREDNISolone (SOLU-MEDROL) injection Stopped (01/28/19 0527)    Assessment/Plan:  1. Optic neuritis of the left eye with vision loss and eye pain.  Vision slightly improving left eye.  Appreciate neurology consultation and ophthalmology consultation.  High-dose Solu-Medrol for 5 days.  Patient to receive the last dose tonight. MRI of the brain and cervical spine did not show any other lesions suspicious for MS.  ANA negative.  Neurology recommends 7 more days of prednisone taper as outpatient. 2. GERD on Protonix 3. Elevated glucose likely secondary to steroids.  Hemoglobin A1c 5.8.  Patient not a diabetic but steroids  raising the sugars. 4. Constipation.  MiraLAX prescribed  Code Status:     Code Status Orders  (From admission, onward)         Start     Ordered   01/24/19 2217  Full code  Continuous     01/24/19 2217        Code Status History    This patient has a current code status but no historical code status.   Advance Care Planning Activity     Disposition Plan: Likely discharge Saturday morning.  Consultants:  Neurology  Ophthalmology  Time spent: 27 minutes through [place]

## 2019-01-29 LAB — GLUCOSE, CAPILLARY: Glucose-Capillary: 148 mg/dL — ABNORMAL HIGH (ref 70–99)

## 2019-01-29 MED ORDER — DOCUSATE SODIUM 100 MG PO CAPS: 100.0000 mg | ORAL_CAPSULE | Freq: Two times a day (BID) | ORAL | 0 refills | Status: AC | PRN

## 2019-01-29 MED ORDER — OMEPRAZOLE 20 MG PO CPDR: 20.0000 mg | DELAYED_RELEASE_CAPSULE | Freq: Every day | ORAL | 1 refills | Status: AC

## 2019-01-29 MED ORDER — ACETAMINOPHEN 325 MG PO TABS: 650.0000 mg | ORAL_TABLET | Freq: Four times a day (QID) | ORAL | Status: AC | PRN

## 2019-01-29 MED ORDER — PREDNISONE 10 MG (21) PO TBPK: 10.0000 mg | ORAL_TABLET | Freq: Every day | ORAL | 0 refills | Status: AC

## 2019-01-29 MED ORDER — GUAIFENESIN 100 MG/5ML PO SOLN: 5.0000 mL | Freq: Four times a day (QID) | ORAL | 0 refills | Status: AC | PRN

## 2019-01-29 NOTE — Discharge Summary (Signed)
[facility] [place] NAME: Chelsea Higgins    MR#:  [id number]  DATE OF BIRTH:  09-Oct-1975  DATE OF ADMISSION:  01/24/2019 ADMITTING PHYSICIAN: Vaughan Basta, MD  DATE OF DISCHARGE:  01/29/19   PRIMARY CARE PHYSICIAN: Patient, No Pcp Per    ADMISSION DIAGNOSIS:  Optic neuritis [H46.9]  DISCHARGE DIAGNOSIS:  Principal Problem:   Optic neuritis   SECONDARY DIAGNOSIS:   Past Medical History:  Diagnosis Date  . GERD (gastroesophageal reflux disease)   . Gestational diabetes   . Stroke [facility])     HOSPITAL COURSE:  HISTORY OF PRESENT ILLNESS: Chelsea Higgins  is a 43 y.o. female with a known history of gastroesophageal reflux disease, gestational diabetes, stroke-started having headache and loss of vision from left eye for last 6 days.  This is persistence and not going away so decided to come to emergency room.  On further questioning she says that she sees some bright lights and some grayish color to her left eye but cannot see clear. She denies any associated focal neurological symptoms like weakness, tingling, numbness.  She denies any trauma.  She denies any similar episodes in the past. In ER MRI of the brain reported optic neuritis.  ER physician spoke to tele-neurologist who suggested to give 1 g of Solu-Medrol IV for 5 days and admit for further management.  1. Optic neuritis of the left eye with vision loss and eye pain.   Vision  improving left eye.  Appreciate neurology consultation and ophthalmology consultation.  High-dose Solu-Medrol for 5 days given.  Marland Kitchen MRI of the brain and cervical spine did not show any other lesions suspicious for MS.  ANA negative.  Neurology recommends 7 more days of prednisone taper as outpatient.  Outpatient neurology follow-up in 2 to 3 weeks.  Today patient's vision is 20/50 based on Snellen's chart Feels comfortable to go home okay to discharge patient from  neurology standpoint 2. GERD on Protonix 3. Elevated glucose likely secondary to steroids.  Hemoglobin A1c 5.8.  Patient not a diabetic but steroids raising the sugars. 4. Constipation.  MiraLAX as needed   DISCHARGE CONDITIONS:  Fair   CONSULTS OBTAINED:  Treatment Team:  Eulogio Bear, MD Leotis Pain, MD   PROCEDURES  None   DRUG ALLERGIES:  No Known Allergies  DISCHARGE MEDICATIONS:   Allergies as of 01/29/2019   No Known Allergies     Medication List    TAKE these medications   acetaminophen 325 MG tablet Commonly known as: TYLENOL Take 2 tablets (650 mg total) by mouth every 6 (six) hours as needed for fever.   docusate sodium 100 MG capsule Commonly known as: COLACE Take 1 capsule (100 mg total) by mouth 2 (two) times daily as needed for mild constipation.   guaiFENesin 100 MG/5ML Soln Commonly known as: ROBITUSSIN Take 5 mLs (100 mg total) by mouth every 6 (six) hours as needed for cough or to loosen phlegm.   omeprazole 20 MG capsule Commonly known as: PriLOSEC Take 1 capsule (20 mg total) by mouth daily.   PARAGARD INTRAUTERINE COPPER IU by Intrauterine route.   predniSONE 10 MG (21) Tbpk tablet Commonly known as: STERAPRED UNI-PAK 21 TAB Take 1 tablet (10 mg total) by mouth daily. Take 6 tablets by mouth for 1 day followed by  5 tablets by mouth for 1 day followed by  4 tablets by mouth for 1 day followed by  3 tablets  by mouth for 1 day followed by  2 tablets by mouth for 1 day followed by  1 tablet by mouth for a day and stop        DISCHARGE INSTRUCTIONS:   Do not drive until cleared by her neurologist during the follow-up visit Follow-up with primary care physician in 3 to 4 days Follow-up with neurology in 2 to 3 weeks  DIET:  Regular diet  DISCHARGE CONDITION:  Fair  ACTIVITY:  Activity as tolerated  OXYGEN:  Home Oxygen: No.   Oxygen Delivery: room air  DISCHARGE LOCATION:  home   If you experience worsening  of your admission symptoms, develop shortness of breath, life threatening emergency, suicidal or homicidal thoughts you must seek medical attention immediately by calling 911 or calling your MD immediately  if symptoms less severe.  You Must read complete instructions/literature along with all the possible adverse reactions/side effects for all the Medicines you take and that have been prescribed to you. Take any new Medicines after you have completely understood and accpet all the possible adverse reactions/side effects.   Please note  You were cared for by a hospitalist during your hospital stay. If you have any questions about your discharge medications or the care you received while you were in the hospital after you are discharged, you can call the unit and asked to speak with the hospitalist on call if the hospitalist that took care of you is not available. Once you are discharged, your primary care physician will handle any further medical issues. Please note that NO REFILLS for any discharge medications will be authorized once you are discharged, as it is imperative that you return to your primary care physician (or establish a relationship with a primary care physician if you do not have one) for your aftercare needs so that they can reassess your need for medications and monitor your lab values.     Today  Chief Complaint  Patient presents with  . Headache   Patient is feeling much better.  Tolerating diet and left eye vision is slowly improving today her vision is at 20/50 in left eye.  Feels comfortable to go home and instructed strictly not to drive until seen and cleared by neurologist.  Plan of care explained with the help of Spanish interpreter Ms. Alcario Droughtrica  ROS:  CONSTITUTIONAL: Denies fevers, chills. Denies any fatigue, weakness.  EYES: Denies blurry vision, double vision, eye pain.  Left eye vision is improving EARS, NOSE, THROAT: Denies tinnitus, ear pain, hearing  loss. RESPIRATORY: Denies cough, wheeze, shortness of breath.  CARDIOVASCULAR: Denies chest pain, palpitations, edema.  GASTROINTESTINAL: Denies nausea, vomiting, diarrhea, abdominal pain. Denies bright red blood per rectum. GENITOURINARY: Denies dysuria, hematuria. ENDOCRINE: Denies nocturia or thyroid problems. HEMATOLOGIC AND LYMPHATIC: Denies easy bruising or bleeding. SKIN: Denies rash or lesion. MUSCULOSKELETAL: Denies pain in neck, back, shoulder, knees, hips or arthritic symptoms.  NEUROLOGIC: Denies paralysis, paresthesias.  PSYCHIATRIC: Denies anxiety or depressive symptoms.   VITAL SIGNS:  Blood pressure 115/68, pulse (!) 58, temperature 98.9 F (37.2 C), temperature source Oral, resp. rate 16, height 5\' 4"  (1.626 m), weight 80 kg, last menstrual period 01/04/2019, SpO2 98 %.  I/O:    Intake/Output Summary (Last 24 hours) at 01/29/2019 1025 Last data filed at 01/29/2019 0900 Gross per 24 hour  Intake 1581.93 ml  Output -  Net 1581.93 ml    PHYSICAL EXAMINATION:  GENERAL:  43 y.o.-year-old patient lying in the bed with no acute distress.  EYES: Pupils equal, round, reactive to light and accommodation. No scleral icterus. Extraocular muscles intact.  Left eye vision is slowly improving today days at 20/50 per Snellen chart HEENT: Head atraumatic, normocephalic. Oropharynx and nasopharynx clear.  NECK:  Supple, no jugular venous distention. No thyroid enlargement, no tenderness.  LUNGS: Normal breath sounds bilaterally, no wheezing, rales,rhonchi or crepitation. No use of accessory muscles of respiration.  CARDIOVASCULAR: S1, S2 normal. No murmurs, rubs, or gallops.  ABDOMEN: Soft, non-tender, non-distended. Bowel sounds present. EXTREMITIES: No pedal edema, cyanosis, or clubbing.  NEUROLOGIC: Cranial nerves II through XII are intact. Muscle strength 5/5 in all extremities. Sensation intact. Gait not checked.  PSYCHIATRIC: The patient is alert and oriented x 3.  SKIN: No  obvious rash, lesion, or ulcer.   DATA REVIEW:   CBC Recent Labs  Lab 01/25/19 0523  WBC 5.9  HGB 12.0  HCT 37.4  PLT 306    Chemistries  Recent Labs  Lab 01/27/19 0424  NA 136  K 3.9  CL 101  CO2 24  GLUCOSE 197*  BUN 17  CREATININE 0.71  CALCIUM 8.8*    Cardiac Enzymes No results for input(s): TROPONINI in the last 168 hours.  Microbiology Results  Results for orders placed or performed during the hospital encounter of 01/24/19  SARS CORONAVIRUS 2 (TAT 6-24 HRS) Nasopharyngeal Nasopharyngeal Swab     Status: None   Collection Time: 01/24/19  9:52 PM   Specimen: Nasopharyngeal Swab  Result Value Ref Range Status   SARS Coronavirus 2 NEGATIVE NEGATIVE Final    Comment: (NOTE) SARS-CoV-2 target nucleic acids are NOT DETECTED. The SARS-CoV-2 RNA is generally detectable in upper and lower respiratory specimens during the acute phase of infection. Negative results do not preclude SARS-CoV-2 infection, do not rule out co-infections with other pathogens, and should not be used as the sole basis for treatment or other patient management decisions. Negative results must be combined with clinical observations, patient history, and epidemiological information. The expected result is Negative. Fact Sheet for Patients: HairSlick.no Fact Sheet for Healthcare Providers: quierodirigir.com This test is not yet approved or cleared by the [place] FDA and  has been authorized for detection and/or diagnosis of SARS-CoV-2 by FDA under an Emergency Use Authorization (EUA). This EUA will remain  in effect (meaning this test can be used) for the duration of the COVID-19 declaration under Section 56 4(b)(1) of the Act, 21 U.S.C. section 360bbb-3(b)(1), unless the authorization is terminated or revoked sooner. Performed at [facility], [address] [place], [place] [address]     RADIOLOGY:  Mr Cervical Spine  W Wo Contrast  Result Date: 01/25/2019 CLINICAL DATA:  Optic neuritis. EXAM: MRI CERVICAL SPINE WITHOUT AND WITH CONTRAST TECHNIQUE: Multiplanar and multiecho pulse sequences of the cervical spine, to include the craniocervical junction and cervicothoracic junction, were obtained without and with intravenous contrast. CONTRAST:  8mL GADAVIST GADOBUTROL 1 MMOL/ML IV SOLN COMPARISON:  None. FINDINGS: Alignment: Cervical spine straightening. Trace anterolisthesis of C2 on C3. Vertebrae: No fracture, suspicious osseous lesion, or significant marrow edema. Cord: Normal signal and morphology. No abnormal enhancement. Posterior Fossa, vertebral arteries, paraspinal tissues: Unremarkable. Disc levels: Mild disc bulging from C3-4 to C6-7 without significant stenosis or significant spinal cord mass effect. IMPRESSION: 1. Normal appearance of the cervical spinal cord. 2. Mild cervical spondylosis without stenosis. Electronically Signed   By: Sebastian Ache M.D.   On: 01/25/2019 19:41    EKG:   Orders placed or performed  during the hospital encounter of 01/24/19  . ED EKG  . ED EKG  . EKG 12-Lead  . EKG 12-Lead  . EKG      Management plans discussed with the patient, family and they are in agreement.  CODE STATUS:     Code Status Orders  (From admission, onward)         Start     Ordered   01/24/19 2217  Full code  Continuous     01/24/19 2217        Code Status History    This patient has a current code status but no historical code status.   Advance Care Planning Activity      TOTAL TIME TAKING CARE OF THIS PATIENT: 43 minutes.   Note: This dictation was prepared with Dragon dictation along with smaller phrase technology. Any transcriptional errors that result from this process are unintentional.   @[facility] @  on 01/29/2019 at 10:25 AM  Between 7am to 6pm - Pager - [phone]  After 6pm go to [email or web address] - password [email or web address]  [phone] Hospitalists  Office   [phone]  CC: Primary care physician; Patient, No Pcp Per

## 2019-01-29 NOTE — Progress Notes (Signed)
Much improved. Patient is now able to count and see fingers on L side.   Past Medical History:  Diagnosis Date  . GERD (gastroesophageal reflux disease)   . Gestational diabetes   . Stroke [facility])     Past Surgical History:  Procedure Laterality Date  . COLPOSCOPY  02/27/2016   Colpo bx results: Transformation zone tissue with no dysplasia or carcinoma identified    Family History  Problem Relation Age of Onset  . Diabetes Mother     Social History:  reports that she has never smoked. She has never used smokeless tobacco. She reports that she does not drink alcohol or use drugs.  No Known Allergies    Physical Examination: Blood pressure 114/65, pulse 72, temperature 98.7 F (37.1 C), temperature source Oral, resp. rate 16, height 5\' 4"  (1.626 m), weight 80 kg, last menstrual period 01/04/2019, SpO2 97 %.   Neurological Examination   Mental Status: Alert, oriented, thought content appropriate.  Speech fluent without evidence of aphasia.  Able to follow 3 step commands without difficulty. Cranial Nerves: II: Sees grayish images L eye but cant tell me how many fingers I am showing her on L eye.  III,IV, VI: ptosis not present, extra-ocular motions intact bilaterally V,VII: smile symmetric, facial light touch sensation normal bilaterally VIII: hearing normal bilaterally IX,X: gag reflex present XI: bilateral shoulder shrug XII: midline tongue extension Motor: Right : Upper extremity   5/5    Left:     Upper extremity   5/5  Lower extremity   5/5     Lower extremity   5/5 Tone and bulk:normal tone throughout; no atrophy noted Sensory: Pinprick and light touch intact throughout, bilaterally Deep Tendon Reflexes: 2+ and symmetric throughout Plantars: Right: downgoing   Left: downgoing Cerebellar: normal finger-to-nose, normal rapid alternating movements and normal heel-to-shin test Gait: not tested      Laboratory Studies:   Basic Metabolic Panel: Recent Labs  Lab  01/24/19 1513 01/25/19 0523 01/27/19 0424  NA 137 135 136  K 3.9 4.0 3.9  CL 104 104 101  CO2 21* 19* 24  GLUCOSE 101* 163* 197*  BUN 12 13 17   CREATININE 0.64 0.81 0.71  CALCIUM 8.9 8.9 8.8*    Liver Function Tests: No results for input(s): AST, ALT, ALKPHOS, BILITOT, PROT, ALBUMIN in the last 168 hours. No results for input(s): LIPASE, AMYLASE in the last 168 hours. No results for input(s): AMMONIA in the last 168 hours.  CBC: Recent Labs  Lab 01/24/19 1513 01/25/19 0523  WBC 7.1 5.9  NEUTROABS 3.5  --   HGB 11.6* 12.0  HCT 36.7 37.4  MCV 80.1 79.4*  PLT 277 306    Cardiac Enzymes: No results for input(s): CKTOTAL, CKMB, CKMBINDEX, TROPONINI in the last 168 hours.  BNP: Invalid input(s): POCBNP  CBG: Recent Labs  Lab 01/28/19 0729 01/28/19 1207 01/28/19 1645 01/28/19 2054 01/29/19 0755  GLUCAP 144* 219* 127* 139* 148*    Microbiology: Results for orders placed or performed during the hospital encounter of 01/24/19  SARS CORONAVIRUS 2 (TAT 6-24 HRS) Nasopharyngeal Nasopharyngeal Swab     Status: None   Collection Time: 01/24/19  9:52 PM   Specimen: Nasopharyngeal Swab  Result Value Ref Range Status   SARS Coronavirus 2 NEGATIVE NEGATIVE Final    Comment: (NOTE) SARS-CoV-2 target nucleic acids are NOT DETECTED. The SARS-CoV-2 RNA is generally detectable in upper and lower respiratory specimens during the acute phase of infection. Negative results do not preclude  SARS-CoV-2 infection, do not rule out co-infections with other pathogens, and should not be used as the sole basis for treatment or other patient management decisions. Negative results must be combined with clinical observations, patient history, and epidemiological information. The expected result is Negative. Fact Sheet for Patients: HairSlick.no Fact Sheet for Healthcare Providers: quierodirigir.com This test is not yet approved or  cleared by the [place] FDA and  has been authorized for detection and/or diagnosis of SARS-CoV-2 by FDA under an Emergency Use Authorization (EUA). This EUA will remain  in effect (meaning this test can be used) for the duration of the COVID-19 declaration under Section 56 4(b)(1) of the Act, 21 U.S.C. section 360bbb-3(b)(1), unless the authorization is terminated or revoked sooner. Performed at [facility], [address] [place], [place] [address]     Coagulation Studies: No results for input(s): LABPROT, INR in the last 72 hours.  Urinalysis:  Recent Labs  Lab 01/24/19 1513  COLORURINE YELLOW*  LABSPEC 1.010  PHURINE 7.0  GLUCOSEU NEGATIVE  HGBUR NEGATIVE  BILIRUBINUR NEGATIVE  KETONESUR NEGATIVE  PROTEINUR NEGATIVE  NITRITE NEGATIVE  LEUKOCYTESUR LARGE*    Lipid Panel:  No results found for: CHOL, TRIG, HDL, CHOLHDL, VLDL, LDLCALC  HgbA1C:  Lab Results  Component Value Date   HGBA1C 5.8 (H) 01/25/2019    Urine Drug Screen:  No results found for: LABOPIA, COCAINSCRNUR, LABBENZ, AMPHETMU, THCU, LABBARB  Alcohol Level: No results for input(s): ETH in the last 168 hours.  Other results: EKG: normal EKG, normal sinus rhythm, unchanged from previous tracings.  Imaging: No results found.   Assessment/Plan:  43 y.o. female with a known history of gastroesophageal reflux disease, gestational diabetes, stroke-started having headache and loss of vision from left eye for last 6 days that is painful.    - Vision much improved - Finished solumedrol for 5 total doses with significant improvement in vision  - Prednisone 7 days taper after d/c - Neurology follow up as out patient  01/29/2019, 12:27 PM

## 2019-01-29 NOTE — Discharge Instructions (Signed)
Do not drive until cleared by her neurologist during the follow-up visit Follow-up with primary care physician in 3 to 4 days Follow-up with neurology in 2 to 3 weeks

## 2019-01-29 NOTE — Progress Notes (Signed)
Reviewed AVS with interpreter and gave AVS and prescriptions to patient. Patient voiced understanding. Discharged home with her brother.

## 2019-02-14 DIAGNOSIS — E559 Vitamin D deficiency, unspecified: Secondary | ICD-10-CM | POA: Insufficient documentation

## 2019-03-11 DIAGNOSIS — Z8673 Personal history of transient ischemic attack (TIA), and cerebral infarction without residual deficits: Secondary | ICD-10-CM | POA: Insufficient documentation

## 2019-03-11 DIAGNOSIS — K219 Gastro-esophageal reflux disease without esophagitis: Secondary | ICD-10-CM | POA: Insufficient documentation

## 2020-05-28 ENCOUNTER — Ambulatory Visit (LOCAL_COMMUNITY_HEALTH_CENTER): Payer: Self-pay | Admitting: Advanced Practice Midwife

## 2020-05-28 ENCOUNTER — Other Ambulatory Visit: Payer: Self-pay

## 2020-05-28 ENCOUNTER — Encounter: Payer: Self-pay | Admitting: Advanced Practice Midwife

## 2020-05-28 VITALS — BP 113/63 | Ht 59.5 in | Wt 175.4 lb

## 2020-05-28 DIAGNOSIS — Z8742 Personal history of other diseases of the female genital tract: Secondary | ICD-10-CM

## 2020-05-28 DIAGNOSIS — Z3049 Encounter for surveillance of other contraceptives: Secondary | ICD-10-CM

## 2020-05-28 DIAGNOSIS — U071 COVID-19: Secondary | ICD-10-CM

## 2020-05-28 DIAGNOSIS — E669 Obesity, unspecified: Secondary | ICD-10-CM

## 2020-05-28 DIAGNOSIS — Z3009 Encounter for other general counseling and advice on contraception: Secondary | ICD-10-CM

## 2020-05-28 LAB — WET PREP FOR TRICH, YEAST, CLUE
Trichomonas Exam: NEGATIVE
Yeast Exam: NEGATIVE

## 2020-05-28 LAB — HEMOGLOBIN, FINGERSTICK: Hemoglobin: 12.6 g/dL (ref 11.1–15.9)

## 2020-05-28 NOTE — Progress Notes (Signed)
Patient here for PE and IUD check. Last PE and Paraguard insertion at [facility] 05/19/2018. Declines bloodwork today. Last Pap 05/19/2017, NIL and HPV negative.Marland KitchenMarland KitchenBurt Knack, RN

## 2020-05-28 NOTE — Progress Notes (Signed)
Wet mount and Hgb reviewed, no treatment indicated..Hazel Wrinkle Brewer-Jensen, RN  

## 2020-05-28 NOTE — Progress Notes (Signed)
[facility] [address] Main Number: [phone]    Family Planning Visit- Initial Visit  Subjective:  Chelsea Higgins is a 45 y.o. MHF nonsmoker [id number]   being seen today for an initial well woman visit and to discuss family planning options.  She is currently using IUD Paragard for pregnancy prevention. Patient reports she does not want a pregnancy in the next year.  Patient has the following medical conditions has History of abnormal cervical Papanicolaou smear 2015. 2017; Optic neuritis; and Obesity on their problem list.  Chief Complaint  Patient presents with  . Annual Exam    With IUD check    Patient reports here for IUD check and PE.  Last PE 05/19/18.  LMP 05/21/20, last sex yesterday without condom; with current partner x6 years; 1 partner in last year.  Hx abnormal paps:  05/19/18 neg HPV neg, 05/19/17 neg HPV neg, 01/07/16 LGSIL HPV +, colpo 02/27/16 with bx, no ECC, CIN 1 and recommend pap in 1 year.  2015 LSIL HPV +.  Paraguard inserted 05/19/2018. +Covid 07/2019.  Unemployed, living with 2 kids and her husband.    Patient denies smoking, drugs, ETOH   Body mass index is 34.83 kg/m. - Patient is eligible for diabetes screening based on BMI and age >8?  yes HA1C ordered? yes  Patient reports 1  partner/s in last year. Desires STI screening?  No - pt declines  Has patient been screened once for HCV in the past?  No  No results found for: HCVAB  Does the patient have current drug use (including MJ), have a partner with drug use, and/or has been incarcerated since last result? No  If yes-- Screen for HCV through [facility]   Does the patient meet criteria for HBV testing? No  Criteria:  -Household, sexual or needle sharing contact with HBV -History of drug use -HIV positive -Those with known Hep C   Health Maintenance Due  Topic Date Due  . Hepatitis C Screening  Never done  . COVID-19  Vaccine (1) Never done  . TETANUS/TDAP  Never done  . INFLUENZA VACCINE  11/27/2019    Review of Systems  All other systems reviewed and are negative.   The following portions of the patient's history were reviewed and updated as appropriate: allergies, current medications, past family history, past medical history, past social history, past surgical history and problem list. Problem list updated.   See flowsheet for other program required questions.  Objective:   Vitals:   05/28/20 0949  BP: 113/63  Weight: 175 lb 6.4 oz (79.6 kg)  Height: 4' 11.5" (1.511 m)    Physical Exam Constitutional:      Appearance: Normal appearance. She is obese.  HENT:     Head: Normocephalic and atraumatic.     Mouth/Throat:     Mouth: Mucous membranes are moist.  Eyes:     Conjunctiva/sclera: Conjunctivae normal.  Cardiovascular:     Rate and Rhythm: Normal rate and regular rhythm.  Pulmonary:     Effort: Pulmonary effort is normal.     Breath sounds: Normal breath sounds.  Chest:  Breasts:     Right: Normal.     Left: Normal.    Abdominal:     Palpations: Abdomen is soft.     Comments: Soft without masses or tenderness, poor tone, increased adipose  Genitourinary:    General: Normal vulva.     Exam position: Lithotomy  position.     Vagina: Vaginal discharge (red menses blood, moderate) present.     Cervix: Normal.     Uterus: Normal.      Adnexa: Right adnexa normal and left adnexa normal.     Rectum: Normal.     Comments: Paraguard strings visualized Musculoskeletal:        General: Normal range of motion.     Cervical back: Normal range of motion and neck supple.  Skin:    General: Skin is warm and dry.  Neurological:     Mental Status: She is alert.  Psychiatric:        Mood and Affect: Mood normal.       Assessment and Plan:  Chelsea Higgins is a 45 y.o. female presenting to the [facility] Department for an initial well woman exam/family  planning visit  Contraception counseling: Reviewed all forms of birth control options in the tiered based approach. available including abstinence; over the counter/barrier methods; hormonal contraceptive medication including pill, patch, ring, injection,contraceptive implant, ECP; hormonal and nonhormonal IUDs; permanent sterilization options including vasectomy and the various tubal sterilization modalities. Risks, benefits, and typical effectiveness rates were reviewed.  Questions were answered.  Written information was also given to the patient to review.  Patient desires continuation of Paraguard, this was prescribed for patient. She will follow up in prn for surveillance.  She was told to call with any further questions, or with any concerns about this method of contraception.  Emphasized use of condoms 100% of the time for STI prevention.  Patient was not offered ECP.Marland Kitchen ECP was not accepted by the patient. ECP counseling was not given - see RN documentation  1. Family planning Treat wet mount per standing orders Immunization nurse consult Please give primary care MD list to pt - WET PREP FOR TRICH, YEAST, CLUE - Hemoglobin, venipuncture - Hgb A1c w/o eAG - IGP, Aptima HPV  2. Encounter for surveillance of other contraceptive Paraguard strings visualized  3. Obesity, unspecified classification, unspecified obesity type, unspecified whether serious comorbidity present   4. History of abnormal cervical Papanicolaou smear 2015. 2017 Pap done today     No follow-ups on file.  No future appointments.  Alberteen Spindle, CNM

## 2020-05-29 LAB — HGB A1C W/O EAG: Hgb A1c MFr Bld: 5.8 % — ABNORMAL HIGH (ref 4.8–5.6)

## 2020-05-30 ENCOUNTER — Telehealth: Payer: Self-pay | Admitting: General Practice

## 2020-05-30 NOTE — Telephone Encounter (Signed)
Per spanish interpreter jose id [phone] pt had pfizer and does not remember the dates

## 2020-05-31 LAB — IGP, APTIMA HPV
HPV Aptima: NEGATIVE
PAP Smear Comment: 0

## 2020-06-22 DIAGNOSIS — R7303 Prediabetes: Secondary | ICD-10-CM | POA: Insufficient documentation

## 2020-06-22 DIAGNOSIS — Z0000 Encounter for general adult medical examination without abnormal findings: Secondary | ICD-10-CM | POA: Insufficient documentation

## 2021-09-10 ENCOUNTER — Ambulatory Visit: Payer: Self-pay

## 2021-09-17 ENCOUNTER — Encounter: Payer: Self-pay | Admitting: Family Medicine

## 2021-09-17 ENCOUNTER — Ambulatory Visit (LOCAL_COMMUNITY_HEALTH_CENTER): Payer: Self-pay | Admitting: Family Medicine

## 2021-09-17 VITALS — BP 108/70 | Ht 59.5 in | Wt 179.4 lb

## 2021-09-17 DIAGNOSIS — Z3009 Encounter for other general counseling and advice on contraception: Secondary | ICD-10-CM

## 2021-09-17 DIAGNOSIS — Z30431 Encounter for routine checking of intrauterine contraceptive device: Secondary | ICD-10-CM

## 2021-09-17 DIAGNOSIS — N6322 Unspecified lump in the left breast, upper inner quadrant: Secondary | ICD-10-CM

## 2021-09-17 DIAGNOSIS — Z01419 Encounter for gynecological examination (general) (routine) without abnormal findings: Secondary | ICD-10-CM

## 2021-09-17 DIAGNOSIS — Z113 Encounter for screening for infections with a predominantly sexual mode of transmission: Secondary | ICD-10-CM

## 2021-09-17 DIAGNOSIS — Z1272 Encounter for screening for malignant neoplasm of vagina: Secondary | ICD-10-CM

## 2021-09-17 DIAGNOSIS — Z3202 Encounter for pregnancy test, result negative: Secondary | ICD-10-CM

## 2021-09-17 LAB — WET PREP FOR TRICH, YEAST, CLUE
Trichomonas Exam: NEGATIVE
Yeast Exam: NEGATIVE

## 2021-09-17 LAB — HM HIV SCREENING LAB: HM HIV Screening: NEGATIVE

## 2021-09-17 LAB — PREGNANCY, URINE: Preg Test, Ur: NEGATIVE

## 2021-09-17 LAB — HEMOGLOBIN, FINGERSTICK: Hemoglobin: 11.9 g/dL (ref 11.1–15.9)

## 2021-09-17 NOTE — Progress Notes (Signed)
[facility] [place] Number: [phone]  Family Planning Visit- Repeat Yearly Visit  Subjective:  Chelsea Higgins is a 46 y.o. [phone]  being seen today for an annual wellness visit and to discuss contraception options.   The patient is currently using {Upstream End Methods:24109} for pregnancy prevention. Patient {DOES_DOES NF:2365131 want a pregnancy in the next year.    report they are looking for a method that provides {Contraception Wants:27264}   Patient has the following medical problems: has History of abnormal cervical Papanicolaou smear 2015. 2017; Optic neuritis; Obesity; and COVID-19 07/2019 on their problem list.  Chief Complaint  Patient presents with  . Annual Exam    PE & IUD    Patient reports***  Patient denies ***   See flowsheet for other program required questions.   Body mass index is 35.63 kg/m. - Patient is eligible for diabetes screening based on BMI and age >44?  {YES/NO/NOT APPLICABLE:20182} [id number] ordered? {YES/NO/NOT APPLICABLE:20182}  Patient reports {NUMBER 1-10:22536} of partners in last year. Desires STI screening?  {Yes or If no, why not?:20788}   Has patient been screened once for HCV in the past?  {yes/no:20286}  No results found for: HCVAB  Does the patient have current of drug use, have a partner with drug use, and/or has been incarcerated since last result? {yes/no:20286}  If yes-- Screen for HCV through [facility]   Does the patient meet criteria for HBV testing? {yes/no:20286}  Criteria:  -Household, sexual or needle sharing contact with HBV -History of drug use -HIV positive -Those with known Hep C   Health Maintenance Due  Topic Date Due  . COVID-19 Vaccine (1) Never done  . Hepatitis C Screening  Never done    ROS  The following portions of the patient's history were reviewed and updated as appropriate: allergies, current medications,  past family history, past medical history, past social history, past surgical history and problem list. Problem list updated.  Objective:   Vitals:   09/17/21 0832  BP: 108/70  Weight: 179 lb 6.4 oz (81.4 kg)  Height: 4' 11.5" (1.511 m)    Physical Exam    Assessment and Plan:  Chelsea Higgins is a 46 y.o. female [phone] presenting to the [facility] Department for an yearly wellness and contraception visit   Contraception counseling: Reviewed options based on patient desire and reproductive life plan. Patient is interested in {Upstream End Methods:24109}. This {WAS/WAS NOT:[phone]::"was not"} provided to the patient today. *** if not why not clearly documented  Risks, benefits, and typical effectiveness rates were reviewed.  Questions were answered.  Written information was also given to the patient to review.    The patient will follow up in  {NUMBER 1-10:22536} {days/wks/mos/yrs:310907} for surveillance.  The patient was told to call with any further questions, or with any concerns about this method of contraception.  Emphasized use of condoms 100% of the time for STI prevention.  Patient was assessed for need for ECP. Patient was offered ECP based on {unprotected sex categories:26659}.  Patient is within {NUMBER 1-10:22536} days of unprotected sex. Patient was offered ECP. Reviewed options and patient desired {ECP options:27263}    1. Smear, vaginal, as part of routine gynecological examination *** - IGP, Aptima HPV - Pregnancy, urine - Hemoglobin, venipuncture - Hgb A1c w/o eAG  2. Screening examination for venereal disease *** - Chlamydia/Gonorrhea [facility] - Syphilis Serology,  Lab - HIV  [facility] - WET PREP FOR [place], YEAST, [place]     Return for annual well woman exam.  No future appointments.  Chelsea Dresser, FNP

## 2021-09-17 NOTE — Patient Instructions (Signed)
por sangrado: tomar 800 mg de ibuprofeno cada 8 horas durante 5 [address]  [address]

## 2021-09-18 LAB — HGB A1C W/O EAG: Hgb A1c MFr Bld: 6 % — ABNORMAL HIGH (ref 4.8–5.6)

## 2021-09-24 ENCOUNTER — Encounter: Payer: Self-pay | Admitting: *Deleted

## 2021-09-24 ENCOUNTER — Ambulatory Visit: Payer: Self-pay | Attending: Hematology and Oncology | Admitting: *Deleted

## 2021-09-24 ENCOUNTER — Ambulatory Visit
Admission: RE | Admit: 2021-09-24 | Discharge: 2021-09-24 | Disposition: A | Discharge status: Home / Self Care | Payer: Self-pay | Source: Ambulatory Visit | Attending: Obstetrics and Gynecology | Admitting: Obstetrics and Gynecology

## 2021-09-24 VITALS — BP 112/56 | HR 64 | Resp 18 | Wt 181.0 lb

## 2021-09-24 DIAGNOSIS — N6322 Unspecified lump in the left breast, upper inner quadrant: Secondary | ICD-10-CM | POA: Insufficient documentation

## 2021-09-24 NOTE — Progress Notes (Signed)
Chelsea Higgins is a 46 y.o. female who presents to [facility] clinic today with complaint of a left breast mass that was found by her provider at the [facility] Department on 09/17/21.   Left breast mass was noted at 11:00.    Pap Smear: Pap not smear completed today. Last Pap smear was 09/17/21 at [facility] clinic and was  results are still pending . Per patient has history of an abnormal Pap smear. Last Pap smear result is not available in Epic for review today.   Physical exam: BreastsPhysical Exam Chest:  Breasts:    Right: No swelling, bleeding, inverted nipple, mass, nipple discharge, skin change or tenderness.     Left: No swelling, bleeding, inverted nipple, mass, nipple discharge, skin change or tenderness.    Lymphadenopathy:     Upper Body:     Right upper body: No supraclavicular or axillary adenopathy.     Left upper body: No supraclavicular or axillary adenopathy.     Pelvic/Bimanual Pap is not indicated today    Smoking History: Patient has never smoked.   Patient Navigation: Patient education provided. Access to services provided for patient through [facility] program. AVM interpreter provided. No transportation provided   Colorectal Cancer Screening: Patient is not age appropriate for colorectal screening.  No complaints today.    Breast and Cervical Cancer Risk Assessment: Patient does not have family history of breast cancer, known genetic mutations, or radiation treatment to the chest before age 14. Patient does not have history of cervical dysplasia, immunocompromised, or DES exposure in-utero.  Risk Assessment   No risk assessment data    Risk Assessment     Risk Scores       09/24/2021   Last edited by: Lesle Chris, RN   5-year risk: 0.4 %   Lifetime risk: 5.5 %             A: [facility] exam without pap smear Complaint of left breast mass.  P: Referred patient to the [facility]  for a diagnostic mammogram since her provider did feel at mass. Appointment scheduled today.  Jim Like, RN 09/24/2021 8:33 AM

## 2021-09-24 NOTE — Progress Notes (Deleted)
  Subjective:     Patient ID: Chelsea Higgins, female   DOB: 10/21/1975, 46 y.o.   MRN: [id number]  HPI   Review of Systems     Objective:   Physical Exam Chest:  Breasts:    Right: No swelling, bleeding, inverted nipple, mass, nipple discharge, skin change or tenderness.     Left: No swelling, bleeding, inverted nipple, mass, nipple discharge, skin change or tenderness.    Lymphadenopathy:     Upper Body:     Right upper body: No supraclavicular or axillary adenopathy.     Left upper body: No supraclavicular or axillary adenopathy.      Assessment:     ***    Plan:     ***

## 2021-09-27 LAB — IGP, APTIMA HPV
HPV Aptima: NEGATIVE
PAP Smear Comment: 0

## 2021-11-26 ENCOUNTER — Encounter: Payer: Self-pay | Admitting: Advanced Practice Midwife

## 2021-11-26 ENCOUNTER — Ambulatory Visit (LOCAL_COMMUNITY_HEALTH_CENTER): Payer: Self-pay | Admitting: Advanced Practice Midwife

## 2021-11-26 VITALS — BP 113/59 | HR 61 | Ht 59.5 in | Wt 179.4 lb

## 2021-11-26 DIAGNOSIS — Z3009 Encounter for other general counseling and advice on contraception: Secondary | ICD-10-CM

## 2021-11-26 DIAGNOSIS — Z01419 Encounter for gynecological examination (general) (routine) without abnormal findings: Secondary | ICD-10-CM

## 2021-11-26 LAB — WET PREP FOR TRICH, YEAST, CLUE
Trichomonas Exam: NEGATIVE
Yeast Exam: NEGATIVE

## 2021-11-26 NOTE — Progress Notes (Signed)
Wet mount reviewed during clinic visit - no treatment indicated.   Patient is aware she will receive a PAP card in 3-4 weeks and that GC/Chlamydia will result in about 3 weeks and will receive a call of anything abnormal.   Chelsea Iba, RN

## 2021-11-26 NOTE — Progress Notes (Signed)
[facility] [address] Main Number: [phone]  Family Planning Visit- Repeat Yearly Visit  Subjective:  Chelsea Higgins is a 46 y.o. MHF nonsmoker [id number] (27, 26, 24,7) being seen today for an annual wellness visit and to discuss contraception options.   The patient is currently using IUD or IUS for pregnancy prevention. Patient does not want a pregnancy in the next year.    report they are looking for a method that provides High efficacy at preventing pregnancy   Patient has the following medical problems: has History of abnormal cervical Papanicolaou smear 2015. 2017; Optic neuritis; Obesity BMI=35.6; COVID-19 07/2019; Cervical dysplasia; Encounter for general adult medical examination without abnormal findings; Gastroesophageal reflux disease; History of cerebrovascular accident; Prediabetes; and Vitamin D deficiency on their problem list.  Chief Complaint  Patient presents with   Abnormal Pap Smear    PAP Smear collected 09/17/2021 with insufficient squamous cellarity.    Patient reports here for pap. Last physical 09/17/21. Last pap 09/17/21 insufficient cellularity so unsatisfactory for testing. Mammogram done 09/24/21 through [facility]. Paraguard placed 05/19/18. LMP 11/22/21. Last sex 11/15/21 without condom; with current partner x 9 years. C/o red spotting x 1 month.  Patient denies any problems  See flowsheet for other program required questions.   Body mass index is 35.63 kg/m. - Patient is eligible for diabetes screening based on BMI and age >66?  yes HA1C ordered? Already done on 05/28/20  Patient reports 1 of partners in last year. Desires STI screening?  Yes   Has patient been screened once for HCV in the past?  No  No results found for: "HCVAB"  Does the patient have current of drug use, have a partner with drug use, and/or has been incarcerated since last result? No  If yes-- Screen for HCV through  [facility]   Does the patient meet criteria for HBV testing? No  Criteria:  -Household, sexual or needle sharing contact with HBV -History of drug use -HIV positive -Those with known Hep C   Health Maintenance Due  Topic Date Due   COVID-19 Vaccine (1) Never done   Hepatitis C Screening  Never done   INFLUENZA VACCINE  11/26/2021    ROS  The following portions of the patient's history were reviewed and updated as appropriate: allergies, current medications, past family history, past medical history, past social history, past surgical history and problem list. Problem list updated.  Objective:   Vitals:   11/26/21 0945  BP: (!) 113/59  Pulse: 61  Weight: 179 lb 6.4 oz (81.4 kg)  Height: 4' 11.5" (1.511 m)    Physical Exam Vitals and nursing note reviewed.  Constitutional:      Appearance: Normal appearance. She is obese.  HENT:     Head: Normocephalic.  Eyes:     Conjunctiva/sclera: Conjunctivae normal.  Pulmonary:     Effort: Pulmonary effort is normal.  Abdominal:     Palpations: Abdomen is soft.  Genitourinary:    General: Normal vulva.     Exam position: Lithotomy position.     Vagina: Vaginal discharge (light red blood from os) present.     Cervix: Friability (friable to pap, red blood, ph>4.5) present.     Comments: IUD string visible Friable to pap Musculoskeletal:        General: Normal range of motion.  Skin:    General: Skin is warm and dry.  Neurological:     Mental Status: She  is alert. Mental status is at baseline.      Assessment and Plan:  Chelsea Higgins is a 46 y.o. female [phone] presenting to the [facility] Department for an yearly wellness and contraception visit   Contraception counseling: Reviewed options based on patient desire and reproductive life plan. Patient is interested in IUD or IUS. This was provided to the patient today.  if not why not clearly documented  Risks, benefits, and typical  effectiveness rates were reviewed.  Questions were answered.  Written information was also given to the patient to review.    The patient will follow up in  1 years for surveillance.  The patient was told to call with any further questions, or with any concerns about this method of contraception.  Emphasized use of condoms 100% of the time for STI prevention.  Patient was assessed for need for ECP. Patient was offered ECP based on not meeting criteria.  Patient is within 0 days of unprotected sex. Patient was offered ECP. Reviewed options and patient desired No method of ECP, declined all     1. Family planning Treat wet mount per standing orders  - IGP, Aptima HPV - WET PREP FOR TRICH, YEAST, CLUE - Chlamydia/Gonorrhea [facility]  2. Well woman exam with routine gynecological exam History abnormal paps; next cotest 04/2023     No follow-ups on file.  No future appointments.  Alberteen Spindle, CNM

## 2021-11-29 LAB — IGP, APTIMA HPV
HPV Aptima: NEGATIVE
PAP Smear Comment: 0

## 2022-01-23 ENCOUNTER — Ambulatory Visit: Payer: Self-pay

## 2022-05-14 ENCOUNTER — Ambulatory Visit (LOCAL_COMMUNITY_HEALTH_CENTER): Payer: Self-pay | Admitting: Family Medicine

## 2022-05-14 VITALS — BP 104/63 | Ht 59.5 in | Wt 167.0 lb

## 2022-05-14 DIAGNOSIS — Z3009 Encounter for other general counseling and advice on contraception: Secondary | ICD-10-CM

## 2022-05-14 DIAGNOSIS — Z30432 Encounter for removal of intrauterine contraceptive device: Secondary | ICD-10-CM

## 2022-05-14 DIAGNOSIS — Z30011 Encounter for initial prescription of contraceptive pills: Secondary | ICD-10-CM

## 2022-05-14 MED ORDER — NORGESTIM-ETH ESTRAD TRIPHASIC 0.18/0.215/0.25 MG-35 MCG PO TABS: 1.0000 | ORAL_TABLET | Freq: Every day | ORAL | 2 refills | Status: AC

## 2022-05-14 NOTE — Progress Notes (Signed)
[facility] Problem visit  [place] Department  Subjective:  Chelsea Higgins is a 47 y.o. being seen today for IUD removal and Rx for OCPS  Chief Complaint  Patient presents with   Contraception    Remove IUD    HPI   Does the patient have a current or past history of drug use? No   No components found for: "HCV"]   Health Maintenance Due  Topic Date Due   COVID-19 Vaccine (1) Never done   Hepatitis C Screening  Never done   INFLUENZA VACCINE  11/26/2021    ROS  The following portions of the patient's history were reviewed and updated as appropriate: allergies, current medications, past family history, past medical history, past social history, past surgical history and problem list. Problem list updated.   See flowsheet for other program required questions.  Objective:   Vitals:   05/14/22 1339  BP: 104/63  Weight: 167 lb (75.8 kg)  Height: 4' 11.5" (1.511 m)    Physical Exam Genitourinary:    General: Normal vulva.     Exam position: Lithotomy position.     Pubic Area: No rash or pubic lice.      Tanner stage (genital): 5.     Labia:        Right: No tenderness.        Left: No tenderness.      Vagina: Normal. No vaginal discharge or bleeding.     Cervix: No cervical motion tenderness or cervical bleeding.       Assessment and Plan:  Chelsea Higgins is a 47 y.o. female presenting to the [facility] Department for a Women's Health problem visit  1. Encounter for IUD removal Patient has had the IUD for about 4 years- states that she has pain, irregular bleeding and strongly desires the IUD to be removed. IUD Removal  Patient identified, informed consent performed, consent signed.  Patient was in the dorsal lithotomy position, normal external genitalia was noted.  A speculum was placed in the patient's vagina, normal discharge was noted, no lesions. The cervix was visualized, no lesions, no  abnormal discharge.  The strings of the IUD were grasped and pulled using ring forceps. The IUD was removed in its entirety.  The strings of the IUD were not visualized, cytobrush was attempted which was unsuccessful so Kelly forceps were introduced into the endometrial cavity and the IUD was grasped and removed in its entirety.  Patient tolerated the procedure well.    Patient will use OCPS for contraception.  2. Family planning  - Norgestimate-Ethinyl Estradiol Triphasic (TRI-SPRINTEC) 0.18/0.215/0.25 MG-35 MCG tablet; Take 1 tablet by mouth daily.  Dispense: 28 tablet; Refill: 2   Return in about 3 months (around 08/13/2022), or if symptoms worsen or fail to improve, for for OCP refill.  No future appointments.  Sharlet Salina, [place]

## 2022-05-14 NOTE — Progress Notes (Addendum)
Pt is here for IUD removal and OCP's.  The patient was dispensed Tri Sprintec # 3 packs.  today. I provided counseling today regarding the medication. We discussed the medication, the side effects and when to call clinic. Patient given the opportunity to ask questions. Questions answered.  Windle Guard, RN

## 2022-08-05 ENCOUNTER — Ambulatory Visit: Payer: Self-pay | Admitting: Family

## 2022-08-05 ENCOUNTER — Encounter: Payer: Self-pay | Admitting: Family

## 2022-08-05 VITALS — BP 113/76 | Ht 59.5 in | Wt 166.2 lb

## 2022-08-05 DIAGNOSIS — Z3009 Encounter for other general counseling and advice on contraception: Secondary | ICD-10-CM

## 2022-08-05 DIAGNOSIS — Z3041 Encounter for surveillance of contraceptive pills: Secondary | ICD-10-CM

## 2022-08-05 DIAGNOSIS — Z3202 Encounter for pregnancy test, result negative: Secondary | ICD-10-CM

## 2022-08-05 LAB — PREGNANCY, URINE: Preg Test, Ur: NEGATIVE

## 2022-08-05 MED ORDER — NORETHIN ACE-ETH ESTRAD-FE 1-20 MG-MCG PO TABS: 1.0000 | ORAL_TABLET | Freq: Every day | ORAL | 11 refills | Status: DC

## 2022-08-05 MED ORDER — NORETHIN ACE-ETH ESTRAD-FE 1-20 MG-MCG PO TABS: 1.0000 | ORAL_TABLET | Freq: Every day | ORAL | 12 refills | Status: AC

## 2022-08-05 NOTE — Progress Notes (Unsigned)
   [facility] Problem Visit  [facility]  Subjective:  Alvinia Eltzroth is a 47 y.o. being seen today for   Chief Complaint  Patient presents with   Acute Visit    Pt here because menses late-was on OCPs but stopped taking them because caused headaches and nausea    HPI Patient reports nausea and headaches since starting TriSprintec in August 2023, therefore she stopped taking them in the middle of current pack. Had unprotected sex 3 days ago and her period is late so she is worried about pregnancy.   Does the patient have a current or past history of drug use? No   No components found for: "HCV"]   Health Maintenance Due  Topic Date Due   COVID-19 Vaccine (1) Never done   Hepatitis C Screening  Never done    Review of Systems  All other systems reviewed and are negative. Headaches and nausea  The following portions of the patient's history were reviewed and updated as appropriate: allergies, current medications, past family history, past medical history, past social history, past surgical history and problem list. Problem list updated.   See flowsheet for other program required questions.  Objective:   Vitals:   08/05/22 1319  BP: 113/76  Weight: 166 lb 3.2 oz (75.4 kg)  Height: 4' 11.5" (1.511 m)    Physical Exam Physical Exam not indicated today   Assessment and Plan:  Nastasia Placido is a 47 y.o. female presenting to the [facility] Department for a Women's Health problem visit  1. Encounter for surveillance of contraceptive pills Discussed changing to monophasic lower dose OC, patient interested in trying a different pill PRT negative/amenorrhea possibly due to stopping pills midpack Start Hailey Fe 1/20 today- dispense 3 month supply Use condoms as backup contraception if sexually active Given female and female condoms to try while covered by OCs Keep track of headaches RTC x 3 months  for supply visit if headaches improved RTC for repeat PRT if no menses at end of 1st pack of pills  - Pregnancy, urine- negative - norethindrone-ethinyl estradiol-FE (HAILEY FE 1/20) 1-20 MG-MCG tablet; Take 1 tablet by mouth daily.  Dispense: 28 tablet; Refill: 11     No follow-ups on file.  No future appointments.  Jerrell Belfast, FNP

## 2022-08-05 NOTE — Progress Notes (Unsigned)
Pt here for an acute visit.  Stopped taking OCPs approximately 3 weeks or more ago due to headaches and nausea.  States stopped mid pack.  Concerned about pregnancy since menses late.  Urine pregnancy test today negative.  Microgestin Fe 1/20 #3 packs dispensed with patient to start today.  Pt also given female and female condoms to use.  If no problems with current OCPs pt to call for remaining supply to last until next physical due.-Zenaida Tesar, RN

## 2023-01-08 ENCOUNTER — Ambulatory Visit (LOCAL_COMMUNITY_HEALTH_CENTER): Payer: Self-pay

## 2023-01-08 VITALS — BP 123/72 | Ht 58.75 in | Wt 175.0 lb

## 2023-01-08 DIAGNOSIS — Z3009 Encounter for other general counseling and advice on contraception: Secondary | ICD-10-CM

## 2023-01-08 DIAGNOSIS — Z3041 Encounter for surveillance of contraceptive pills: Secondary | ICD-10-CM

## 2023-01-08 DIAGNOSIS — Z3202 Encounter for pregnancy test, result negative: Secondary | ICD-10-CM

## 2023-01-08 LAB — PREGNANCY, URINE: Preg Test, Ur: NEGATIVE

## 2023-01-08 MED ORDER — NORETHIN ACE-ETH ESTRAD-FE 1-20 MG-MCG PO TABS: 1.0000 | ORAL_TABLET | Freq: Every day | ORAL | Status: AC

## 2023-01-08 NOTE — Progress Notes (Signed)
UPT negative.  States she did 3 PTs at home and all negative. Per record, last seen at [facility] 08/05/22 and dispensed 3 packs pills.  Pt states she ran out of pills and took last pills about 2 months ago.   Last sex 01/02/23.  LNMP 10/01/22 and slight spotting in July.  Denies menses since then.  Pt thinks she is going through menopause with headaches and hot flashes.  Does not want to become pregnant,  Consulted E. Sciora CNM and prescribed Microgestin Fe 1/20 2 packs.  Can start OC today; use BUM x 7 days.  Schedule appt for annual PE that was due after 11/28/22.  The patient was dispensed Microgestin Fe 1/20 2 packs as ordered today by E. Sciora CNM.  I provided counseling today regarding the medication. We discussed the medication, the side effects and when to call clinic. Patient given the opportunity to ask questions. Questions answered.     Appt made by clerk for annual PE and contraception 01/30/23; appt card was given.  Estill Dooms served as Equities trader.  Cherlynn Polo, RN

## 2023-01-08 NOTE — Progress Notes (Signed)
UPT negative.  States she did 3 PTs at home and all negative. Per record, last seen at [facility] 08/05/22 and dispensed 3 packs pills.  Pt states she ran out of pills and took last pills about 2 months ago.   Last sex 01/02/23.  LNMP 10/01/22 and slight spotting in July.  Denies menses since then.  Pt thinks she is going through menopause with headaches and hot flashes.  Does not want to become pregnant,  Consulted E. Regan Llorente CNM and prescribed Microgestin Fe 1/20 2 packs.  Can start OC today; use BUM x 7 days.  Schedule appt for annual PE that was due after 11/28/22.  The patient was dispensed Microgestin Fe 1/20 2 packs as ordered today by E. Melvin Marmo CNM.  I provided counseling today regarding the medication. We discussed the medication, the side effects and when to call clinic. Patient given the opportunity to ask questions. Questions answered.     Appt made by clerk for annual PE and contraception 01/30/23; appt card was given.  Chelsea Higgins served as Equities trader. Henriette Combs, RN Alberteen Spindle, CNM

## 2023-01-30 ENCOUNTER — Ambulatory Visit: Payer: Self-pay

## 2023-11-28 IMAGING — MG DIGITAL DIAGNOSTIC BILAT W/ TOMO W/ CAD
6 of 10 series · 6 of 30 positions shown · non-contrast
Comparison: None.

CLINICAL DATA: LEFT breast painful/palpable area for 1 month. Pain
comes and goes.

EXAM:
DIGITAL DIAGNOSTIC BILATERAL MAMMOGRAM WITH TOMOSYNTHESIS AND CAD;
ULTRASOUND LEFT BREAST LIMITED
TECHNIQUE: Bilateral digital diagnostic mammography and breast tomosynthesis
was performed. The images were evaluated with computer-aided
detection.; Targeted ultrasound examination of the left breast was
performed.

[L ML synth-2D]
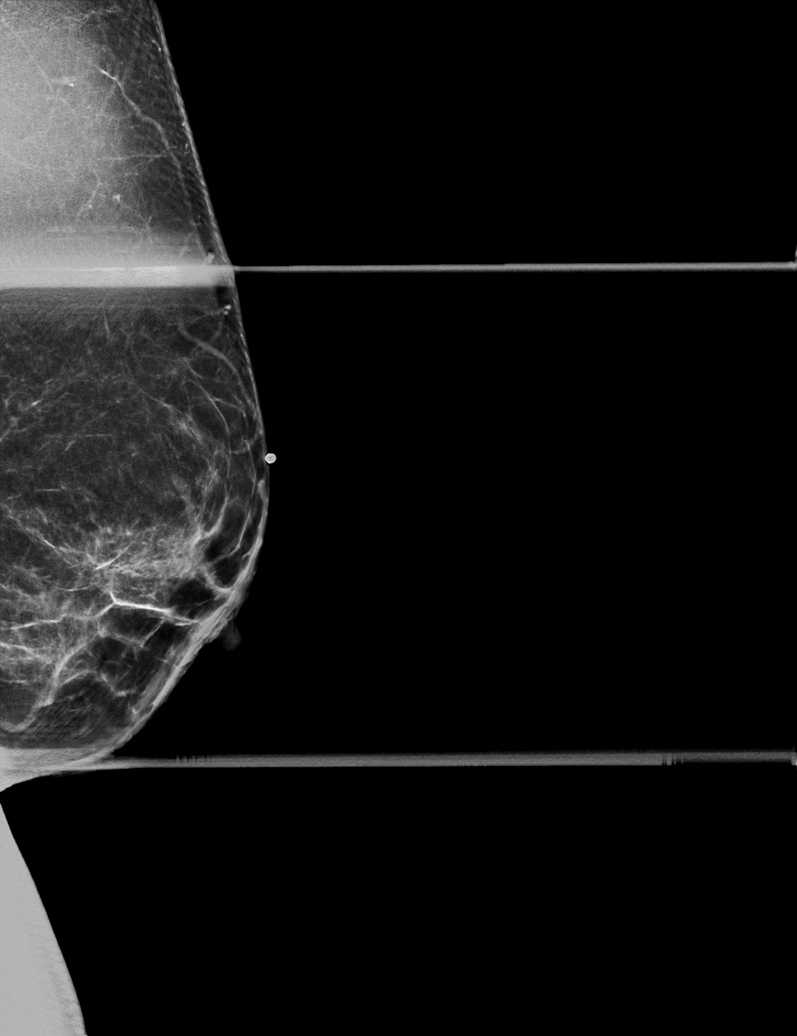

[R CC synth-2D]
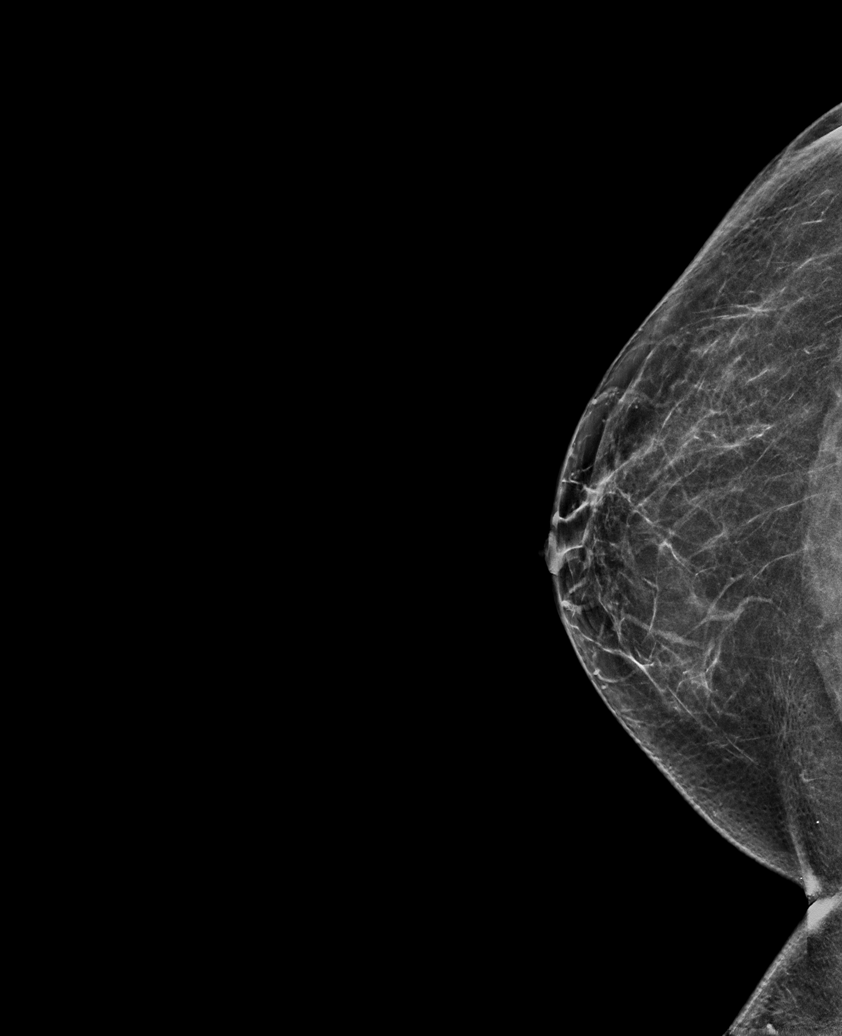

[R MLO synth-2D]
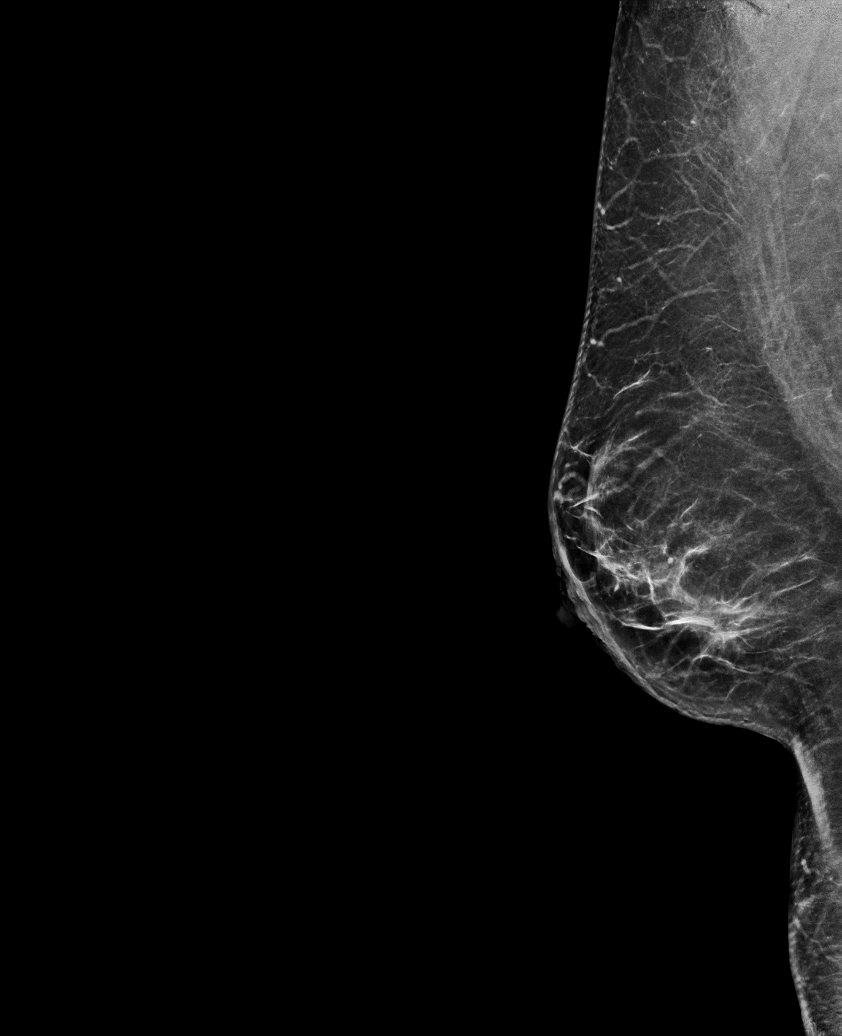

[L MLO synth-2D]
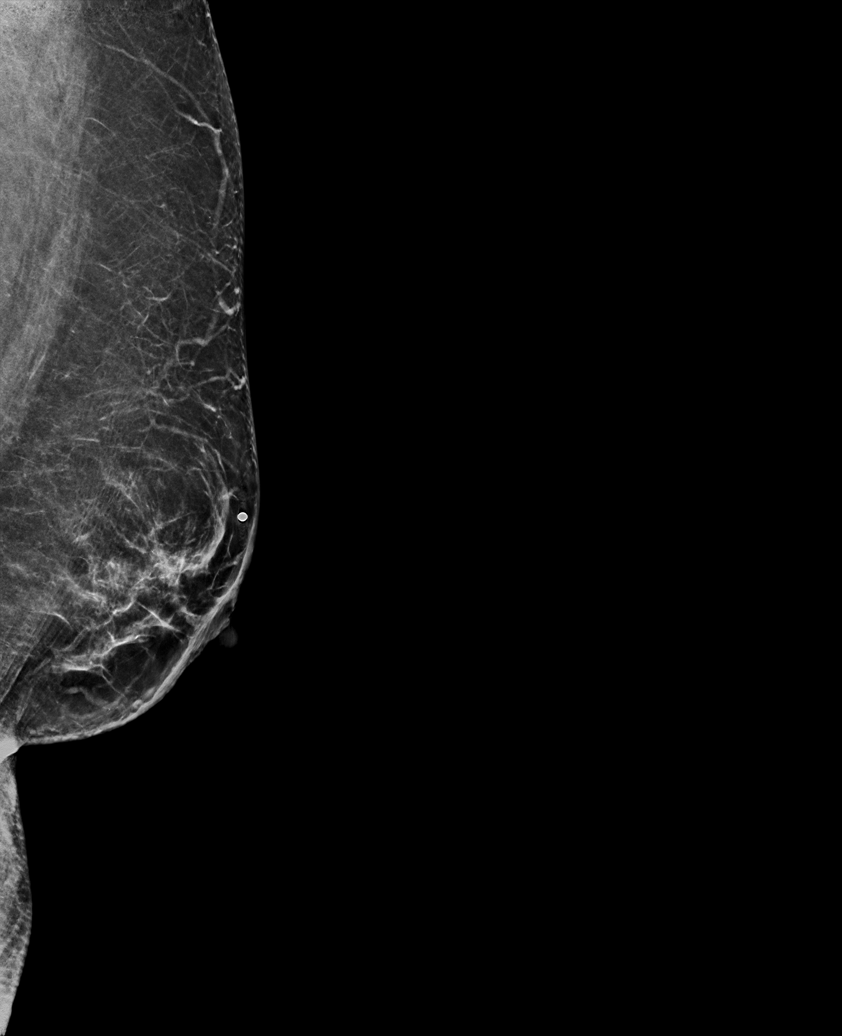

[L CC synth-2D]
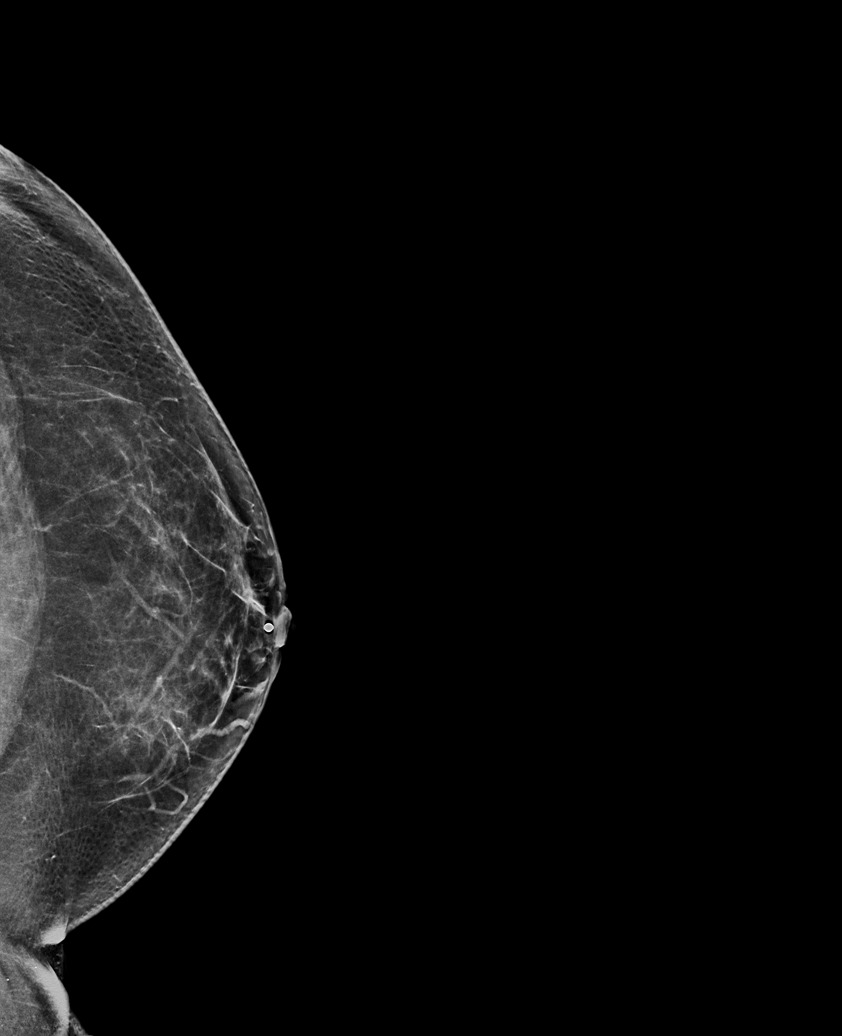

[L ML tomo · tomo slice 29/57.0]
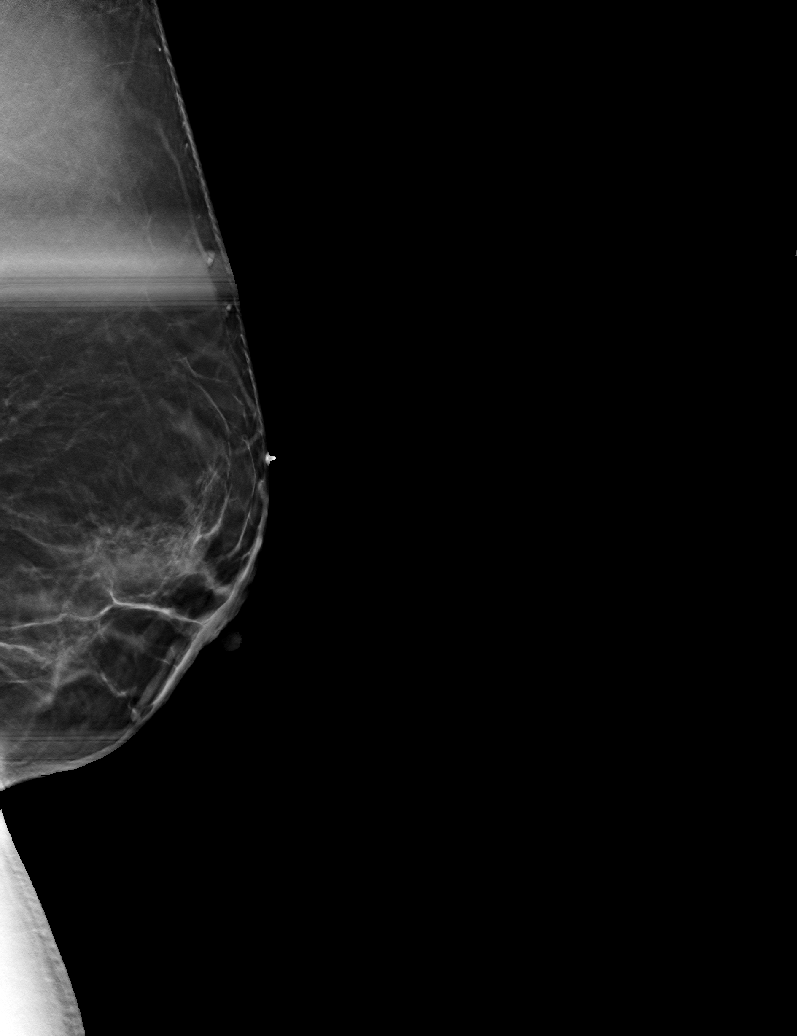

[6 of 30 positions shown; findings below may reference images not displayed]

Baseline.

ACR Breast Density Category b: There are scattered areas of
fibroglandular density.
FINDINGS: Spot compression tomosynthesis views were obtained over the
palpable/painful area of concern in the LEFT breast. No suspicious
mammographic finding is identified in this area. No suspicious mass,
microcalcification, or other finding is identified in the LEFT
breast. No suspicious mass, distortion, or microcalcifications are
identified to suggest presence of malignancy in the RIGHT breast.

On physical exam, no suspicious mass is appreciated.

Targeted LEFT breast ultrasound was performed in the
palpable/painful area of concern at the upper breast. No suspicious
solid or cystic mass is identified.
IMPRESSION: 1. No mammographic or sonographic evidence of malignancy at the site
of painful/palpable concern in the LEFT breast. Any further workup
of the patient's symptoms should be based on the clinical
assessment. Recommend routine annual screening mammogram in 1 year.
2. No mammographic evidence of malignancy bilaterally.

RECOMMENDATION:
Screening mammogram in one year.(Code:8U-2-2RH)

I have discussed the findings and recommendations with the patient
in Spanish with the assistance of a Spanish interpreter. If
applicable, a reminder letter will be sent to the patient regarding
the next appointment.

BI-RADS CATEGORY  1: Negative.
